# Patient Record
Sex: Female | Born: 1985
Health system: Southern US, Community
[De-identification: ages and names within clinical notes are randomized; demographics above are authoritative.]

## PROBLEM LIST (undated history)

## (undated) DIAGNOSIS — M722 Plantar fascial fibromatosis: Secondary | ICD-10-CM

## (undated) DIAGNOSIS — E559 Vitamin D deficiency, unspecified: Secondary | ICD-10-CM

## (undated) DIAGNOSIS — R87619 Unspecified abnormal cytological findings in specimens from cervix uteri: Secondary | ICD-10-CM

## (undated) DIAGNOSIS — B977 Papillomavirus as the cause of diseases classified elsewhere: Secondary | ICD-10-CM

## (undated) DIAGNOSIS — R87629 Unspecified abnormal cytological findings in specimens from vagina: Secondary | ICD-10-CM

## (undated) HISTORY — PX: COLPOSCOPY: SHX161

## (undated) HISTORY — DX: Papillomavirus as the cause of diseases classified elsewhere: B97.7

## (undated) HISTORY — DX: Unspecified abnormal cytological findings in specimens from cervix uteri: R87.619

## (undated) HISTORY — DX: Unspecified abnormal cytological findings in specimens from vagina: R87.629

## (undated) HISTORY — DX: Vitamin D deficiency, unspecified: E55.9

---

## 2016-12-15 DIAGNOSIS — R87613 High grade squamous intraepithelial lesion on cytologic smear of cervix (HGSIL): Secondary | ICD-10-CM | POA: Insufficient documentation

## 2016-12-15 DIAGNOSIS — R87810 Cervical high risk human papillomavirus (HPV) DNA test positive: Secondary | ICD-10-CM | POA: Insufficient documentation

## 2016-12-15 HISTORY — DX: High grade squamous intraepithelial lesion on cytologic smear of cervix (HGSIL): R87.613

## 2020-02-13 ENCOUNTER — Telehealth: Payer: Self-pay | Admitting: *Deleted

## 2020-02-13 NOTE — Telephone Encounter (Signed)
Returned call from 1:32 PM. Left a message to call and schedule appointment.

## 2020-02-13 NOTE — Telephone Encounter (Signed)
Returned call from 02/12/2020 at 4:35 PM. Left message to call and schedule New OB appointment.

## 2020-02-26 ENCOUNTER — Encounter: Payer: Self-pay | Admitting: *Deleted

## 2020-02-28 ENCOUNTER — Other Ambulatory Visit: Payer: Self-pay

## 2020-02-28 ENCOUNTER — Encounter: Payer: Self-pay | Admitting: Obstetrics & Gynecology

## 2020-02-28 ENCOUNTER — Other Ambulatory Visit (HOSPITAL_COMMUNITY)
Admission: RE | Admit: 2020-02-28 | Discharge: 2020-02-28 | Disposition: A | Discharge status: Home / Self Care | Payer: 59 | Source: Ambulatory Visit | Attending: Obstetrics & Gynecology | Admitting: Obstetrics & Gynecology

## 2020-02-28 ENCOUNTER — Ambulatory Visit (INDEPENDENT_AMBULATORY_CARE_PROVIDER_SITE_OTHER): Payer: 59 | Admitting: Obstetrics & Gynecology

## 2020-02-28 VITALS — BP 121/66 | HR 82 | Ht 61.0 in | Wt 180.0 lb

## 2020-02-28 DIAGNOSIS — O26891 Other specified pregnancy related conditions, first trimester: Secondary | ICD-10-CM | POA: Insufficient documentation

## 2020-02-28 DIAGNOSIS — Z7689 Persons encountering health services in other specified circumstances: Secondary | ICD-10-CM | POA: Diagnosis not present

## 2020-02-28 DIAGNOSIS — Z3A10 10 weeks gestation of pregnancy: Secondary | ICD-10-CM | POA: Diagnosis not present

## 2020-02-28 DIAGNOSIS — O09511 Supervision of elderly primigravida, first trimester: Secondary | ICD-10-CM | POA: Diagnosis not present

## 2020-02-28 DIAGNOSIS — G43009 Migraine without aura, not intractable, without status migrainosus: Secondary | ICD-10-CM | POA: Diagnosis not present

## 2020-02-28 DIAGNOSIS — Z3490 Encounter for supervision of normal pregnancy, unspecified, unspecified trimester: Secondary | ICD-10-CM | POA: Diagnosis not present

## 2020-02-28 DIAGNOSIS — Z8742 Personal history of other diseases of the female genital tract: Secondary | ICD-10-CM | POA: Insufficient documentation

## 2020-02-28 DIAGNOSIS — R87613 High grade squamous intraepithelial lesion on cytologic smear of cervix (HGSIL): Secondary | ICD-10-CM | POA: Diagnosis not present

## 2020-02-28 DIAGNOSIS — Z3401 Encounter for supervision of normal first pregnancy, first trimester: Secondary | ICD-10-CM

## 2020-02-28 LAB — HEPATITIS C ANTIBODY: HCV Ab: NEGATIVE

## 2020-02-28 MED ORDER — PRENATAL VITAMIN 27-0.8 MG PO TABS: ORAL_TABLET | ORAL | 11 refills | Status: DC

## 2020-02-28 NOTE — Progress Notes (Signed)
Bedside U/S shows single IUP with FHT of 158 BPM and CRL measures 31.77mm  GA measures [redacted]w[redacted]d

## 2020-02-28 NOTE — Progress Notes (Signed)
History:   Elizabeth Perkins is a 35 y.o. G1P0 at [redacted]w[redacted]d by LMP being seen today for her first obstetrical visit.  Her obstetrical history is significant for advanced maternal age. Patient does intend to breast feed. Pregnancy history fully reviewed.  Patient reports some pelvic cramping that is mild.  Denies any vaginal bleeding.     HISTORY: OB History  Gravida Para Term Preterm AB Living  1 0 0 0 0 0  SAB IAB Ectopic Multiple Live Births  0 0 0 0 0    # Outcome Date GA Lbr Len/2nd Weight Sex Delivery Anes PTL Lv  1 Current             Last pap smear was done 37902 and was abnormal - HGSIL.  Pt never went for follow up.  h/o ASCUS pap the year prior with colposcopy and biopsy showing CIN 1.  Past Medical History:  Diagnosis Date  . Abnormal Pap smear of cervix    HGSIL 2019 Never followed up  . HPV in female   . Vaginal Pap smear, abnormal    ASCUS  . Vitamin D deficiency    Past Surgical History:  Procedure Laterality Date  . COLPOSCOPY     History reviewed. No pertinent family history. Social History   Tobacco Use  . Smoking status: Never Smoker  . Smokeless tobacco: Never Used  Vaping Use  . Vaping Use: Never used  Substance Use Topics  . Alcohol use: Not Currently  . Drug use: Never   No Known Allergies Current Outpatient Medications on File Prior to Visit  Medication Sig Dispense Refill  . SUMAtriptan (IMITREX) 25 MG tablet TAKE 1 TABLET BY MOUTH EVERY 2 HOURS AS NEEDED FOR MIGRAINE. NO MORE THAN 4 TABLETS IN 24 HOURS    . Multiple Vitamin (MULTIVITAMIN) tablet Take 1 tablet by mouth daily.     No current facility-administered medications on file prior to visit.    Review of Systems Pertinent items noted in HPI and remainder of comprehensive ROS otherwise negative.  Physical Exam:   Vitals:   02/28/20 1036  BP: 121/66  Pulse: 82  Weight: 180 lb (81.6 kg)  Height: 5\' 1"  (1.549 m)   Fetal Heart Rate (bpm): 158 Bedside Ultrasound for FHR check:  Viable intrauterine pregnancy with positive cardiac activity noted, fetal heart rate 158bpm Patient informed that the ultrasound is considered a limited obstetric ultrasound and is not intended to be a complete ultrasound exam.  Patient also informed that the ultrasound is not being completed with the intent of assessing for fetal or placental anomalies or any pelvic abnormalities.  Explained that the purpose of today's ultrasound is to assess for fetal heart rate.  Patient acknowledges the purpose of the exam and the limitations of the study. General: well-developed, well-nourished female in no acute distress  Breasts:  normal appearance, no masses or tenderness bilaterally  Skin: normal coloration and turgor, no rashes  Neurologic: oriented, normal, negative, normal mood  Extremities: normal strength, tone, and muscle mass, ROM of all joints is normal  HEENT PERRLA, extraocular movement intact and sclera clear, anicteric  Neck supple and no masses  Cardiovascular: regular rate and rhythm  Respiratory:  no respiratory distress, normal breath sounds  Abdomen: soft, non-tender; bowel sounds normal; no masses,  no organomegaly  Pelvic: normal external genitalia, no lesions, normal vaginal mucosa, normal vaginal discharge, normal cervix, pap smear done. Uterine size:  10-12 weeks    Assessment:  Pregnancy: G1P0 Patient Active Problem List   Diagnosis Date Noted  . Encounter for supervision of normal pregnancy 02/28/2020  . ASCUS with positive high risk HPV cervical 12/15/2016     Plan:    1. [redacted] weeks gestation of pregnancy - Obstetric panel - HIV antibody (with reflex) - Hepatitis C Antibody - Hemoglobinopathy Evaluation - Babyscripts Schedule Optimization - [place] bedside; Future - Culture, OB Urine - Cytology - PAP( [place])  2. Encounter for supervision of normal first pregnancy in first trimester - continue PNV  3. History of abnormal cervical Pap smear - pap smear with HR  HPV obtained today  4. primigravid of advanced maternal age in first trimester - will start baby ASA after 13 weeks.  5. Migraine without aura and without status migrainosus, not intractable - pt aware to stop all migraine medications   Initial labs drawn. Continue prenatal vitamins. Problem list reviewed and updated. Genetic Screening discussed, First trimester screen and Quad screen: Declined for now.Marland Kitchen Ultrasound discussed; fetal anatomic survey: requested. Anticipatory guidance about prenatal visits given including labs, ultrasounds, and testing. Discussed usage of Babyscripts and virtual visits as additional source of managing and completing prenatal visits in midst of coronavirus and pandemic.   Encouraged to complete MyChart Registration for her ability to review results, send requests, and have questions addressed.  The nature of [place] [facility] with multiple MDs and Advanced Practice Providers was explained to patient; also emphasized that residents, students are part of our team. Routine obstetric precautions reviewed. Encouraged to seek out care at office or emergency room [facility] preferred) for urgent and/or emergent concerns. Return in about 4 weeks (around 03/27/2020).     Lum Keas, MD, FACOG Obstetrician & Gynecologist, [facility] for [facility], [facility]

## 2020-03-01 LAB — CYTOLOGY - PAP
Chlamydia: NEGATIVE
Comment: NEGATIVE
Comment: NEGATIVE
Comment: NORMAL
Diagnosis: HIGH — AB
High risk HPV: POSITIVE — AB
Neisseria Gonorrhea: NEGATIVE

## 2020-03-01 LAB — URINE CULTURE, OB REFLEX

## 2020-03-01 LAB — CULTURE, OB URINE

## 2020-03-04 LAB — OBSTETRIC PANEL
Absolute Monocytes: 946 cells/uL (ref 200–950)
Antibody Screen: NOT DETECTED
Basophils Absolute: 44 cells/uL (ref 0–200)
Basophils Relative: 0.4 %
Eosinophils Absolute: 66 cells/uL (ref 15–500)
Eosinophils Relative: 0.6 %
HCT: 37.7 % (ref 35.0–45.0)
Hemoglobin: 12.9 g/dL (ref 11.7–15.5)
Hepatitis B Surface Ag: NONREACTIVE
Lymphs Abs: 1683 cells/uL (ref 850–3900)
MCH: 27.1 pg (ref 27.0–33.0)
MCHC: 34.2 g/dL (ref 32.0–36.0)
MCV: 79.2 fL — ABNORMAL LOW (ref 80.0–100.0)
MPV: 10.8 fL (ref 7.5–12.5)
Monocytes Relative: 8.6 %
Neutro Abs: 8261 cells/uL — ABNORMAL HIGH (ref 1500–7800)
Neutrophils Relative %: 75.1 %
Platelets: 321 10*3/uL (ref 140–400)
RBC: 4.76 10*6/uL (ref 3.80–5.10)
RDW: 14.1 % (ref 11.0–15.0)
RPR Ser Ql: NONREACTIVE
Rubella: 3.03 Index
Total Lymphocyte: 15.3 %
WBC: 11 10*3/uL — ABNORMAL HIGH (ref 3.8–10.8)

## 2020-03-04 LAB — HEMOGLOBINOPATHY EVALUATION
Fetal Hemoglobin Testing: <1 % (ref 0.0–1.9)
HCT: 39.4 % (ref 35.0–45.0)
Hemoglobin A2 - HGBRFX: 2.3 % (ref 2.2–3.2)
Hemoglobin: 13 g/dL (ref 11.7–15.5)
Hgb A: 97.7 % (ref >96.0)
MCH: 26.3 pg — ABNORMAL LOW (ref 27.0–33.0)
MCV: 79.6 fL — ABNORMAL LOW (ref 80.0–100.0)
RBC: 4.95 10*6/uL (ref 3.80–5.10)
RDW: 14.4 % (ref 11.0–15.0)

## 2020-03-04 LAB — HIV ANTIBODY (ROUTINE TESTING W REFLEX): HIV 1&2 Ab, 4th Generation: NONREACTIVE

## 2020-03-04 LAB — HEPATITIS C ANTIBODY
Hepatitis C Ab: NONREACTIVE
SIGNAL TO CUT-OFF: 0 (ref <1.00)

## 2020-03-24 ENCOUNTER — Telehealth: Payer: Self-pay | Admitting: *Deleted

## 2020-03-24 MED ORDER — DOXYLAMINE-PYRIDOXINE 10-10 MG PO TBEC: 1.0000 | DELAYED_RELEASE_TABLET | Freq: Two times a day (BID) | ORAL | 3 refills | Status: DC

## 2020-03-24 NOTE — Telephone Encounter (Signed)
Pt request anti nausea me be sent in to her pharmacy.  Per protocol [facility] to [facility].

## 2020-03-27 ENCOUNTER — Encounter: Payer: Self-pay | Admitting: Obstetrics and Gynecology

## 2020-03-27 ENCOUNTER — Ambulatory Visit (INDEPENDENT_AMBULATORY_CARE_PROVIDER_SITE_OTHER): Payer: 59 | Admitting: Obstetrics and Gynecology

## 2020-03-27 ENCOUNTER — Other Ambulatory Visit: Payer: Self-pay

## 2020-03-27 VITALS — BP 112/65 | HR 85 | Wt 181.0 lb

## 2020-03-27 DIAGNOSIS — Z31430 Encounter of female for testing for genetic disease carrier status for procreative management: Secondary | ICD-10-CM | POA: Diagnosis not present

## 2020-03-27 DIAGNOSIS — R87613 High grade squamous intraepithelial lesion on cytologic smear of cervix (HGSIL): Secondary | ICD-10-CM | POA: Diagnosis not present

## 2020-03-27 DIAGNOSIS — Z3402 Encounter for supervision of normal first pregnancy, second trimester: Secondary | ICD-10-CM

## 2020-03-27 NOTE — Progress Notes (Signed)
   PRENATAL VISIT NOTE  Subjective:  Elizabeth Perkins is a 35 y.o. G1P0 at [redacted]w[redacted]d being seen today for ongoing prenatal care.  She is currently monitored for the following issues for this high-risk pregnancy and has HGSIL (high grade squamous intraepithelial lesion) on Pap smear of cervix and Encounter for supervision of normal pregnancy on their problem list.  Patient reports no complaints.  Contractions: Not present. Vag. Bleeding: None.  Movement: Absent. Denies leaking of fluid.   The following portions of the patient's history were reviewed and updated as appropriate: allergies, current medications, past family history, past medical history, past social history, past surgical history and problem list.   Objective:   Vitals:   03/27/20 1454  BP: 112/65  Pulse: 85  Weight: 181 lb (82.1 kg)    Fetal Status: Fetal Heart Rate (bpm): 161   Movement: Absent     General:  Alert, oriented and cooperative. Patient is in no acute distress.  Skin: Skin is warm and dry. No rash noted.   Cardiovascular: Normal heart rate noted  Respiratory: Normal respiratory effort, no problems with respiration noted  Abdomen: Soft, gravid, appropriate for gestational age.  Pain/Pressure: Absent     Pelvic: Cervical exam deferred        Extremities: Normal range of motion.  Edema: None  Mental Status: Normal mood and affect. Normal behavior. Normal judgment and thought content.   Assessment and Plan:  Pregnancy: G1P0 at [redacted]w[redacted]d 1. Encounter for supervision of normal first pregnancy in second trimester Patient is doing well without complaints Continue ASA Anatomy ultrasound scheduled in March Panorama today  2. HGSIL (high grade squamous intraepithelial lesion) on Pap smear of cervix Colpo done today. See separate note  Preterm labor symptoms and general obstetric precautions including but not limited to vaginal bleeding, contractions, leaking of fluid and fetal movement were reviewed in detail with the  patient. Please refer to After Visit Summary for other counseling recommendations.   No follow-ups on file.  Future Appointments  Date Time Provider Department Center  04/30/2020 10:30 AM [facility] US3 [facility]    Catalina Antigua, MD

## 2020-03-27 NOTE — Progress Notes (Signed)
Patient with HGSIL on 02/2020 pap smear here for colposcopy  Patient given informed consent, signed copy in the chart, time out was performed.  Placed in lithotomy position. Cervix viewed with speculum and colposcope after application of acetic acid.   Colposcopy adequate?  yes Acetowhite lesions? no Punctation? no Mosaicism?  no Abnormal vasculature?  no Biopsies? no ECC? no  COMMENTS:  Patient was given post procedure instructions.  Plan for repeat colposcopy 6 weeks pp  Catalina Antigua, MD

## 2020-03-28 DIAGNOSIS — Z3482 Encounter for supervision of other normal pregnancy, second trimester: Secondary | ICD-10-CM | POA: Diagnosis not present

## 2020-04-07 DIAGNOSIS — Z3402 Encounter for supervision of normal first pregnancy, second trimester: Secondary | ICD-10-CM

## 2020-04-14 ENCOUNTER — Other Ambulatory Visit: Payer: Self-pay | Admitting: *Deleted

## 2020-04-14 DIAGNOSIS — Z141 Cystic fibrosis carrier: Secondary | ICD-10-CM | POA: Insufficient documentation

## 2020-04-14 HISTORY — DX: Cystic fibrosis carrier: Z14.1

## 2020-04-14 NOTE — Progress Notes (Signed)
Message sent to patient via mychart informing her that she is a carrier of CF gene.  Ambulatory referral sent for MFM genetics for counseling and partner to be tested.  Encouraged pt to call office if she has any questions.

## 2020-04-23 ENCOUNTER — Ambulatory Visit: Payer: Self-pay

## 2020-04-23 ENCOUNTER — Other Ambulatory Visit: Payer: Self-pay

## 2020-04-23 ENCOUNTER — Ambulatory Visit: Payer: 59 | Attending: Obstetrics and Gynecology | Admitting: Genetic Counselor

## 2020-04-23 DIAGNOSIS — Z315 Encounter for genetic counseling: Secondary | ICD-10-CM | POA: Diagnosis not present

## 2020-04-23 DIAGNOSIS — O09892 Supervision of other high risk pregnancies, second trimester: Secondary | ICD-10-CM

## 2020-04-23 DIAGNOSIS — Z141 Cystic fibrosis carrier: Secondary | ICD-10-CM

## 2020-04-23 NOTE — Progress Notes (Signed)
04/23/2020  Elizabeth Perkins 11-10-1985 MRN: [id number] DOV: 04/23/2020  Elizabeth Perkins presented to the [facility] for a genetics consultation regarding her carrier status for cystic fibrosis. Elizabeth Perkins was accompanied to her appointment by her partner, Elizabeth Perkins.   Indication for genetic counseling - Carrier for cystic fibrosis  Prenatal history  Elizabeth Perkins is a G55P0, 35 y.o. female. Her current pregnancy has completed [redacted]w[redacted]d(Estimated Date of Delivery: 09/24/20).  Elizabeth Perkins exposure to environmental toxins or chemical agents. She denied the use of alcohol, tobacco or street drugs. She reported taking prenatal vitamins and doxylamine-pyridoxine. She denied significant viral illnesses, fevers, and bleeding during the course of her pregnancy. Her medical and surgical histories were noncontributory.  Family History  A three generation pedigree was drafted and reviewed. The family history is remarkable for the following:  - Elizabeth Perkins's maternal grandmother reportedly had four miscarriages. However, she also had seven healthy children. The cause of her miscarriages is unknown.  - Elizabeth Perkins had a history of pancreatitis. It has been suggested that individuals who are carriers for cystic fibrosis are at increased risk for pancreatitis. See Discussion section for more details.  The remaining family histories were reviewed and found to be noncontributory for birth defects, intellectual disability, recurrent pregnancy loss, and known genetic conditions. Elizabeth Perkins limited information about her paternal family history; thus, risk assessment was limited.   The patient's ancestry is [place] The father of the pregnancy's ancestry is [place] Ashkenazi Jewish ancestry and consanguinity were denied. Pedigree will be scanned under Media.  Discussion  Cystic fibrosis:  Elizabeth Perkins referred for  genetic counseling as she had Horizon-14 carrier screening performed that identified her as a carrier for cystic fibrosis (CF). We discussed that CF is a condition characterized by the buildup of thick, sticky mucus that can damage the body's organs. Mucus is a slippery substance that lubricates and protects the linings of the airways, digestive system, reproductive system, and other organs and tissues. Individuals with CF have abnormally sticky mucus that can clog the airways and digestive system, leading to progressive damage to the respiratory system and chronic digestive system problems. The most common features of CF include respiratory difficulties, bacterial infections in the lungs, the formation of scar tissue (fibrosis) and cysts in the lungs, pancreatic insufficiency, CF-related diabetes mellitus, diarrhea, malnutrition, poor growth, and weight loss. Most men with CF have congenital bilateral absence of the vas deferens (CBAVD) which causes female infertility. With therapies, such as daily respiratory therapies and medications to aid digestion, the median lifespan for people with CF is now in the 40's. Treatment may involve lung transplantation and CFTR protein modulators in some cases. CF is variably expressed, meaning features of the condition and their severity vary among affected individuals. Clinical manifestations of CF present in the first year of life for 66% of affected individuals but are lifelong. Early diagnosis of CF is facilitated by newborn screening before the onset of symptoms.   We reviewed that CF is caused by mutations in the CFTR gene. This gene provides instructions for a channel that transports chloride ions into and out of cells. The flow of chloride ions helps control the movement of water in the body's tissues, which is necessary for the production of thin, freely flowing mucus. The CFTR protein also regulates the function of other channels, including those that transport sodium  irons across cell membranes. This process  is necessary for normal function of the lungs and pancreas. Pathogenic variants in the CFTR gene alter the production, structure, or stability of the CFTR protein. All of these changes prevent the associated channels from functioning properly, which impairs the transport of chloride ions and the movement of water into and out of cells. As a result, cells that line the passageway of the lungs, pancreas, and other organs produce mucus that obstructs airways and glands, leading to the characteristic signs and symptoms of CF.   We discussed that expression and severity of CF depends upon the specific pathogenic variants in the CFTR gene in an affected individual. More than 1000 pathogenic variants in the CFTR gene have been described. Most of these variants change single amino acids in the CFTR protein or delete a small amount of DNA from the CFTR gene. Elizabeth Perkins carrier screen was positive for the c.695T>A (p.V232D) variant in the CFTR gene. This variant is associated with pancreatic-sufficient CF when inherited with another severe CF-causing variant. This variant has also been identified in individuals with pulmonary involvement in adulthood and in males with CBAVD. We reviewed that knowing the specific variants the fetus can inherit would be useful in determining possible prognosis and treatment options if the fetus were to be affected by CF.  Elizabeth Perkins was counseled that CF is inherited in an autosomal recessive fashion. This means that the current fetus is only at risk for CF if Elizabeth Perkins's partner is also a carrier for the condition. Based on the carrier frequency for CF in the Caucasian population, Elizabeth Perkins partner has a 1 in 28 chance of being a carrier for CF. If Elizabeth Perkins paternal Perkins had a history of pancreatitis due to him being a CF carrier, Elizabeth Perkins chance of being a CF carrier would be increased to 1 in 4 (25%). If Elizabeth Perkins is also  identified to be a carrier, the risk for CF in the current fetus and each of the couple's future pregnancies would be 1 in 4 (25%).   Ms. Bautch carrier screening was negative for the other 3 conditions screened. Thus, her risk to be a carrier for these additional conditions (listed separately in the laboratory report) has been reduced but not eliminated. This also significantly reduces her risk of having a child affected by one of these conditions. We discussed that carrier testing for CF is recommended for Ms. Bresnan's partner. Ms. Pickelsimer and her partner indicated that they are interested in pursuing partner carrier screening. They also understand that newborn screening performed for all infants in [place] assesses for CF after birth.  We discussed the recommendation that Ms. Ohlendorf inform her siblings about her CF carrier status. If Ms. Carrell inherited her gene change from her mother, her half sister and brother would have a 50% chance of being a carrier for CF themselves. Ms. Millican siblings and their reproductive partners may consider carrier screening for CF either during the preconception period or during pregnancies in the future to refine their chance of having a child affected by CF.  Aneuploidy screening results:  We also reviewed that Ms. Rothbauer had Panorama NIPS through the laboratory [facility] that was low-risk for fetal aneuploidies. We reviewed that these results showed a less than 1 in 10,000 risk for trisomies 21, 18 and 13, and monosomy X (Turner syndrome).  In addition, the risk for triploidy and sex chromosome trisomies (47,XXX and 47,XXY) was also low. Ms. Benner elected to have cfDNA analysis for 22q11.2  deletion syndrome, which was also low risk (1 in 12000). We reviewed that while this testing identifies 94-99% of pregnancies with trisomy 78, trisomy 82, trisomy 23, sex chromosome aneuploidies, and triploidy, it is NOT diagnostic. A positive test result requires  confirmation by CVS or amniocentesis, and a negative test result does not rule out a fetal chromosome abnormality. She also understands that this testing does not identify all genetic conditions.  Diagnostic testing:  Ms. Korte was also counseled regarding diagnostic testing via amniocentesis. We discussed the technical aspects of the procedure and quoted up to a 1 in 500 (0.2%) risk for spontaneous pregnancy loss or other adverse pregnancy outcomes as a result of amniocentesis. Cultured cells from an amniocentesis sample allow for the visualization of a fetal karyotype, which can detect >99% of large chromosomal aberrations. Chromosomal microarray can also be performed to identify smaller deletions or duplications of fetal chromosomal material. Amniocentesis could also be performed to assess whether the baby is affected by CF. After careful consideration, Ms. Yager declined amniocentesis at this time. She understands that amniocentesis is available at any point after 16 weeks of pregnancy and that she may opt to undergo the procedure at a later date should she change her mind. She informed me that if her partner is found to be a carrier for CF, she would prefer to wait for newborn screening to determine if the baby has CF.  Plan:  Ms. Sorenson and her partner Elizabeth Perkins were interested in pursuing partner carrier screening for CF; however, Elizabeth Perkins does not have health insurance. Elizabeth Perkins qualifies for free testing for him through the laboratory [facility]'s Patient Assistance Program. He signed the application form during today's appointment and Ms. Postel agreed to send me a photo of their tax form from last year. I will send this to the lab once I receive it for income verification purposes.   I provided Elizabeth Perkins with a saliva kit and reviewed instructions for sample collection. Results will take 2-3 weeks to be returned from the time the laboratory receives his sample. I will call the couple once  his results become available.  I counseled Ms. Esther regarding the above risks and available options. The approximate face-to-face time with the genetic counselor was 50 minutes.  In summary:  Discussed carrier screening results and options for follow-up testing  Carrier for cystic fibrosis  Opted to pursue partner carrier screening. Partner qualifies for free testing. Patient will send me tax forms for income verification and partner will collect sample via saliva kit. We will follow results  Reviewed low-risk NIPS result  Reduction in risk for Down syndrome, trisomy 47, trisomy 36, triploidy, sex chromosome aneuploidies, and 22q11.2 deletion syndrome  Offered additional testing and screening  Declined amniocentesis  Reviewed family history concerns   Buelah Manis, MS, Counselling psychologist

## 2020-04-24 ENCOUNTER — Ambulatory Visit (INDEPENDENT_AMBULATORY_CARE_PROVIDER_SITE_OTHER): Payer: 59 | Admitting: Obstetrics and Gynecology

## 2020-04-24 ENCOUNTER — Encounter: Payer: Self-pay | Admitting: Obstetrics and Gynecology

## 2020-04-24 ENCOUNTER — Encounter: Payer: Self-pay | Admitting: Genetic Counselor

## 2020-04-24 VITALS — BP 114/61 | HR 77 | Wt 177.0 lb

## 2020-04-24 DIAGNOSIS — Z3402 Encounter for supervision of normal first pregnancy, second trimester: Secondary | ICD-10-CM

## 2020-04-24 DIAGNOSIS — R87613 High grade squamous intraepithelial lesion on cytologic smear of cervix (HGSIL): Secondary | ICD-10-CM

## 2020-04-24 DIAGNOSIS — Z141 Cystic fibrosis carrier: Secondary | ICD-10-CM

## 2020-04-24 DIAGNOSIS — O09519 Supervision of elderly primigravida, unspecified trimester: Secondary | ICD-10-CM | POA: Insufficient documentation

## 2020-04-24 NOTE — Progress Notes (Signed)
   PRENATAL VISIT NOTE  Subjective:  Elizabeth Perkins is a 35 y.o. G1P0 at [redacted]w[redacted]d being seen today for ongoing prenatal care.  She is currently monitored for the following issues for this high-risk pregnancy and has HGSIL (high grade squamous intraepithelial lesion) on Pap smear of cervix; Encounter for supervision of normal pregnancy; Cystic fibrosis gene carrier; and AMA (advanced maternal age) primigravida 35+, unspecified trimester on their problem list.  Patient reports no complaints.  Contractions: Not present. Vag. Bleeding: None.  Movement: Absent. Denies leaking of fluid.   The following portions of the patient's history were reviewed and updated as appropriate: allergies, current medications, past family history, past medical history, past social history, past surgical history and problem list.   Objective:   Vitals:   04/24/20 1308  BP: 114/61  Pulse: 77  Weight: 177 lb (80.3 kg)    Fetal Status: Fetal Heart Rate (bpm): 139   Movement: Absent     General:  Alert, oriented and cooperative. Patient is in no acute distress.  Skin: Skin is warm and dry. No rash noted.   Cardiovascular: Normal heart rate noted  Respiratory: Normal respiratory effort, no problems with respiration noted  Abdomen: Soft, gravid, appropriate for gestational age.  Pain/Pressure: Absent     Pelvic: Cervical exam deferred        Extremities: Normal range of motion.  Edema: None  Mental Status: Normal mood and affect. Normal behavior. Normal judgment and thought content.   Assessment and Plan:  Pregnancy: G1P0 at [redacted]w[redacted]d 1. Encounter for supervision of normal first pregnancy in second trimester Patient is doing well without complaints Anatomy ultrasound next week - Alpha fetoprotein, maternal  2. HGSIL (high grade squamous intraepithelial lesion) on Pap smear of cervix S/p colpo  3. Cystic fibrosis gene carrier Seen by genetic counselor FOB testing in progress  4. AMA (advanced maternal age)  primigravida 35+, unspecified trimester   Preterm labor symptoms and general obstetric precautions including but not limited to vaginal bleeding, contractions, leaking of fluid and fetal movement were reviewed in detail with the patient. Please refer to After Visit Summary for other counseling recommendations.   Return in about 4 weeks (around 05/22/2020) for in person, ROB, Low risk.  Future Appointments  Date Time Provider Department Center  04/30/2020 10:45 AM [facility] US4 [facility]    Catalina Antigua, MD

## 2020-04-30 ENCOUNTER — Other Ambulatory Visit: Payer: Self-pay

## 2020-04-30 ENCOUNTER — Other Ambulatory Visit: Payer: Self-pay | Admitting: *Deleted

## 2020-04-30 ENCOUNTER — Ambulatory Visit: Payer: 59 | Attending: Obstetrics & Gynecology

## 2020-04-30 ENCOUNTER — Other Ambulatory Visit: Payer: Self-pay | Admitting: Obstetrics & Gynecology

## 2020-04-30 DIAGNOSIS — Z363 Encounter for antenatal screening for malformations: Secondary | ICD-10-CM | POA: Diagnosis not present

## 2020-04-30 DIAGNOSIS — Z3A10 10 weeks gestation of pregnancy: Secondary | ICD-10-CM | POA: Diagnosis not present

## 2020-04-30 DIAGNOSIS — Z362 Encounter for other antenatal screening follow-up: Secondary | ICD-10-CM

## 2020-04-30 DIAGNOSIS — Z148 Genetic carrier of other disease: Secondary | ICD-10-CM | POA: Insufficient documentation

## 2020-04-30 DIAGNOSIS — Z3A19 19 weeks gestation of pregnancy: Secondary | ICD-10-CM

## 2020-04-30 DIAGNOSIS — O09512 Supervision of elderly primigravida, second trimester: Secondary | ICD-10-CM | POA: Insufficient documentation

## 2020-04-30 DIAGNOSIS — O358XX0 Maternal care for other (suspected) fetal abnormality and damage, not applicable or unspecified: Secondary | ICD-10-CM | POA: Insufficient documentation

## 2020-05-02 LAB — ALPHA FETOPROTEIN, MATERNAL
AFP MoM: 1.48
AFP, Serum: 57.2 ng/mL
Calc'd Gestational Age: 18.1 weeks
Maternal Wt: 177 [lb_av]
Risk for ONTD: 1
Twins-AFP: 1

## 2020-05-02 LAB — TIQ-MISC

## 2020-05-14 ENCOUNTER — Telehealth: Payer: Self-pay | Admitting: Genetic Counselor

## 2020-05-14 NOTE — Telephone Encounter (Signed)
Second year [employer] genetic counseling student Rolly Pancake Ketchmore called Ms. Elizabeth Perkins and Mr. Elizabeth Perkins to discuss Mr. Elizabeth Perkins negative carrier screening results for cystic fibrosis (CF). Mr. Elizabeth Perkins was negative for pathogenic variants in the CFTR gene associated with CF. Based on this negative result and his ethnic background, he has a 1 in 2700 residual risk of being a carrier for CF. This reduces the chance of the couple having a child with CF to 1 in 10,800 (0.009%). The couple was relieved to hear of these results and confirmed that they had no further questions at this time.   Gershon Crane, MS, [facility] Genetic Counselor

## 2020-05-22 ENCOUNTER — Ambulatory Visit (INDEPENDENT_AMBULATORY_CARE_PROVIDER_SITE_OTHER): Payer: 59 | Admitting: Obstetrics & Gynecology

## 2020-05-22 ENCOUNTER — Other Ambulatory Visit: Payer: Self-pay

## 2020-05-22 VITALS — BP 103/60 | HR 78 | Wt 180.0 lb

## 2020-05-22 DIAGNOSIS — O09519 Supervision of elderly primigravida, unspecified trimester: Secondary | ICD-10-CM

## 2020-05-22 DIAGNOSIS — Z3402 Encounter for supervision of normal first pregnancy, second trimester: Secondary | ICD-10-CM

## 2020-05-22 DIAGNOSIS — Z3A22 22 weeks gestation of pregnancy: Secondary | ICD-10-CM

## 2020-05-22 NOTE — Patient Instructions (Signed)
TDaP Vaccine Pregnancy Get the Whooping Cough Vaccine While You Are Pregnant (CDC)  It is important for women to get the whooping cough vaccine in the third trimester of each pregnancy. Vaccines are the best way to prevent this disease. There are 2 different whooping cough vaccines. Both vaccines combine protection against whooping cough, tetanus and diphtheria, but they are for different age groups: Tdap: for everyone 11 years or older, including pregnant women  DTaP: for children 2 months through 6 years of age  You need the whooping cough vaccine during each of your pregnancies The recommended time to get the shot is during your 27th through 36th week of pregnancy, preferably during the earlier part of this time period. The Centers for Disease Control and Prevention (CDC) recommends that pregnant women receive the whooping cough vaccine for adolescents and adults (called Tdap vaccine) during the third trimester of each pregnancy. The recommended time to get the shot is during your 27th through 36th week of pregnancy, preferably during the earlier part of this time period. This replaces the original recommendation that pregnant women get the vaccine only if they had not previously received it. The American College of Obstetricians and Gynecologists and the American College of Nurse-Midwives support this recommendation.  You should get the whooping cough vaccine while pregnant to pass protection to your baby frame support disabled and/or not supported in this browser  Learn why Elizabeth Perkins decided to get the whooping cough vaccine in her 3rd trimester of pregnancy and how her baby girl was born with some protection against the disease. Also available on YouTube. After receiving the whooping cough vaccine, your body will create protective antibodies (proteins produced by the body to fight off diseases) and pass some of them to your baby before birth. These antibodies provide your baby some short-term  protection against whooping cough in early life. These antibodies can also protect your baby from some of the more serious complications that come along with whooping cough. Your protective antibodies are at their highest about 2 weeks after getting the vaccine, but it takes time to pass them to your baby. So the preferred time to get the whooping cough vaccine is early in your third trimester. The amount of whooping cough antibodies in your body decreases over time. That is why CDC recommends you get a whooping cough vaccine during each pregnancy. Doing so allows each of your babies to get the greatest number of protective antibodies from you. This means each of your babies will get the best protection possible against this disease.  Getting the whooping cough vaccine while pregnant is better than getting the vaccine after you give birth Whooping cough vaccination during pregnancy is ideal so your baby will have short-term protection as soon as he is born. This early protection is important because your baby will not start getting his whooping cough vaccines until he is 2 months old. These first few months of life are when your baby is at greatest risk for catching whooping cough. This is also when he's at greatest risk for having severe, potentially life-threating complications from the infection. To avoid that gap in protection, it is best to get a whooping cough vaccine during pregnancy. You will then pass protection to your baby before he is born. To continue protecting your baby, he should get whooping cough vaccines starting at 2 months old. You may never have gotten the Tdap vaccine before and did not get it during this pregnancy. If so, you should make sure   to get the vaccine immediately after you give birth, before leaving the hospital or birthing center. It will take about 2 weeks before your body develops protection (antibodies) in response to the vaccine. Once you have protection from the vaccine,  you are less likely to give whooping cough to your newborn while caring for him. But remember, your baby will still be at risk for catching whooping cough from others. A recent study looked to see how effective Tdap was at preventing whooping cough in babies whose mothers got the vaccine while pregnant or in the hospital after giving birth. The study found that getting Tdap between 27 through 36 weeks of pregnancy is 85% more effective at preventing whooping cough in babies younger than 2 months old. Blood tests cannot tell if you need a whooping cough vaccine There are no blood tests that can tell you if you have enough antibodies in your body to protect yourself or your baby against whooping cough. Even if you have been sick with whooping cough in the past or previously received the vaccine, you still should get the vaccine during each pregnancy. Breastfeeding may pass some protective antibodies onto your baby By breastfeeding, you may pass some antibodies you have made in response to the vaccine to your baby. When you get a whooping cough vaccine during your pregnancy, you will have antibodies in your breast milk that you can share with your baby as soon as your milk comes in. However, your baby will not get protective antibodies immediately if you wait to get the whooping cough vaccine until after delivering your baby. This is because it takes about 2 weeks for your body to create antibodies. Learn more about the health benefits of breastfeeding.  

## 2020-05-22 NOTE — Progress Notes (Signed)
PRENATAL VISIT NOTE  Subjective:  Elizabeth Perkins is a 35 y.o. G1P0 at [redacted]w[redacted]d being seen today for ongoing prenatal care.  She is currently monitored for the following issues for this low-risk pregnancy and has HGSIL (high grade squamous intraepithelial lesion) on Pap smear of cervix; Encounter for supervision of normal pregnancy; Cystic fibrosis gene carrier; and AMA (advanced maternal age) primigravida 35+, unspecified trimester on their problem list.  Patient reports no complaints.  Contractions: Not present. Vag. Bleeding: None.  Movement: Present. Denies leaking of fluid.   The following portions of the patient's history were reviewed and updated as appropriate: allergies, current medications, past family history, past medical history, past social history, past surgical history and problem list.   Objective:   Vitals:   05/22/20 1351  BP: 103/60  Pulse: 78  Weight: 180 lb (81.6 kg)    Fetal Status: Fetal Heart Rate (bpm): 143   Movement: Present     General:  Alert, oriented and cooperative. Patient is in no acute distress.  Skin: Skin is warm and dry. No rash noted.   Cardiovascular: Normal heart rate noted  Respiratory: Normal respiratory effort, no problems with respiration noted  Abdomen: Soft, gravid, appropriate for gestational age.  Pain/Pressure: Absent     Pelvic: Cervical exam deferred        Extremities: Normal range of motion.  Edema: None  Mental Status: Normal mood and affect. Normal behavior. Normal judgment and thought content.   Imaging: [place] MFM OB DETAIL +14 WK  Result Date: 04/30/2020 ----------------------------------------------------------------------  OBSTETRICS REPORT                       (Signed Final 04/30/2020 04:47 pm) ---------------------------------------------------------------------- Patient Info  ID #:       [id number]                          D.O.B.:  Jan 19, 1986 (34 yrs)  Name:       Elizabeth Perkins                 Visit Date: 04/30/2020 11:36  am ---------------------------------------------------------------------- Performed By  Attending:        Noralee Space MD        Ref. Address:     [address], [place]  Performed By:     Earley Brooke     [facility], [facility]  Referred By:      Everardo [facility] [place] ---------------------------------------------------------------------- Orders  #  Description                           Code        Ordered By  1  US MFM OB DETAIL +14 WK               [id number]    Valentina ShaggyMARY MILLER ----------------------------------------------------------------------  #  Order #                     Accession #                Episode #  1  [id number]                   [id number]                 [id number] ---------------------------------------------------------------------- Indications  Advanced maternal age primigravida 4535+,        37O09.512  second trimester  6019 weeks gestation of pregnancy                Z3A.19  Encounter for antenatal screening for          Z36.3  malformations  Echogenic intracardiac focus of the heart      O35.8XX0  (EIF Left ventricle)  LOW risk NIPS  Genetic carrier (CF carrier)                   Z14.8 ---------------------------------------------------------------------- Fetal Evaluation  Num Of Fetuses:         1  Fetal Heart Rate(bpm):  147  Cardiac Activity:       Observed  Presentation:           Transverse, head to maternal left  Placenta:               Anterior  P. Cord Insertion:      Not well visualized  Amniotic Fluid  AFI FV:      Within normal limits                              Largest Pocket(cm)                              4.8 ---------------------------------------------------------------------- Biometry   BPD:      41.9  mm     G. Age:  18w 5d         37  %    CI:        71.18   %    70 - 86                                                          FL/HC:  17.6   %    16.1 - 18.3  HC:      158.2  mm     G. Age:  18w 5d         28  %    HC/AC:      1.16        1.09 - 1.39  AC:      136.9  mm     G. Age:  19w 1d         50  %    FL/BPD:     66.6   %  FL:       27.9  mm     G. Age:  18w 4d         27  %    FL/AC:      20.4   %    20 - 24  HUM:      27.5  mm     G. Age:  18w 6d         44  %  CER:      18.6  mm     G. Age:  18w 2d         10  %  NFT:       3.6  mm  LV:        5.7  mm  CM:        5.5  mm  Est. FW:     260  gm      0 lb 9 oz     36  % ---------------------------------------------------------------------- OB History  Gravidity:    1         Term:   0        Prem:   0        SAB:   0  TOP:          0       Ectopic:  0        Living: 0 ---------------------------------------------------------------------- Gestational Age  LMP:           19w 0d        Date:  12/19/19                 EDD:   09/24/20  U/S Today:     18w 6d                                        EDD:   09/25/20  Best:          19w 0d     Det. By:  LMP  (12/19/19)          EDD:   09/24/20 ---------------------------------------------------------------------- Anatomy  Cranium:               Appears normal         LVOT:                   Appears normal  Cavum:                 Appears normal         Aortic Arch:            Appears normal  Ventricles:            Appears normal         Ductal Arch:  Appears normal  Choroid Plexus:        Appears normal         Diaphragm:              Appears normal  Cerebellum:            Appears normal         Stomach:                Appears normal, left                                                                        sided  Posterior Fossa:       Appears normal         Abdomen:                Appears normal  Nuchal Fold:           Appears normal         Abdominal Wall:         Appears nml (cord                                                                         insert, abd wall)  Face:                  Appears normal         Cord Vessels:           Appears normal (3                         (orbits and profile)                           vessel cord)  Lips:                  Appears normal         Kidneys:                Appear normal  Palate:                Not well visualized    Bladder:                Appears normal  Thoracic:              Appears normal         Spine:                  Not well visualized  Heart:                 Appears normal; EIF    Upper Extremities:      Visualized  RVOT:                  Appears normal         Lower Extremities:  Visualized  Other:  Fetus appears to be female. Heels visualized. Technically difficult due          to fetal position. ---------------------------------------------------------------------- Cervix Uterus Adnexa  Cervix  Length:           3.17  cm.  Normal appearance by transabdominal scan.  Right Ovary  Within normal limits.  Left Ovary  Within normal limits.  Adnexa  No abnormality visualized. ---------------------------------------------------------------------- Myomas  Site                     L(cm)      W(cm)      D(cm)       Location  Anterior                 2.6        2.9        2.5 ----------------------------------------------------------------------  Blood Flow                  RI       PI       Comments ---------------------------------------------------------------------- Impression  G1P0. Patient is here for fetal anatomy scan.  On cell-free fetal DNA screening, the risks of aneuploidies  were not increased.  Patient is a carrier of cystic fibrosis mutation.  She had  genetic counseling and partner is being screened for carrier  status.  The results are pending.  We performed fetal anatomy scan. An echogenic intracardiac  focus is seen. No other makers of aneuploidies or fetal  structural defects are seen. Fetal biometry is consistent with   her previously-established dates. Amniotic fluid is normal and  good fetal activity is seen.  I informed the patient that given that she had low rik for fetal  aneuploidies on cell-free fetal DNA screening, finding of  echogenic intracardiac focus should be considered a normal  variant and that the risk of trisomy 21 is not increased. I also  reassured that echogenic focus does not increase the risk of  cardiac defects. I also informed her that only amniocentesis  will give a defintive result on the fetal karyotype.  Patient opted not to have amniocentesis. ---------------------------------------------------------------------- Recommendations  -An appointment was made for her to return in 4 weeks for  completion of fetal anatomy (fetal spine). ----------------------------------------------------------------------                  Noralee Space, MD Electronically Signed Final Report   04/30/2020 04:47 pm ----------------------------------------------------------------------   Assessment and Plan:  Pregnancy: G1P0 at [redacted]w[redacted]d 1. AMA (advanced maternal age) primigravida 35+, unspecified trimester 2. [redacted] weeks gestation of pregnancy 3. Encounter for supervision of normal first pregnancy in second trimester Follow up anatomy scan ordered.  No issues. Preterm labor symptoms and general obstetric precautions including but not limited to vaginal bleeding, contractions, leaking of fluid and fetal movement were reviewed in detail with the patient. Please refer to After Visit Summary for other counseling recommendations.   Return in about 3 weeks (around 06/12/2020) for OFFICE OB VISIT (MD)  6 weeks from now: 2 hr GTT, Tdap, 3rd trimester labs, OFFICE OB VISIT (MD).  Future Appointments  Date Time Provider Department Center  05/28/2020  1:00 PM [facility] NURSE [facility]  05/28/2020  1:15 PM [facility] US2 [facility]    Jaynie Collins, MD

## 2020-05-23 ENCOUNTER — Other Ambulatory Visit: Payer: Self-pay

## 2020-05-23 ENCOUNTER — Emergency Department
Admission: EM | Admit: 2020-05-23 | Discharge: 2020-05-23 | Disposition: A | Discharge status: Home / Self Care | Payer: 59 | Source: Home / Self Care

## 2020-05-23 DIAGNOSIS — M722 Plantar fascial fibromatosis: Secondary | ICD-10-CM | POA: Diagnosis not present

## 2020-05-23 MED ORDER — ACETAMINOPHEN 500 MG PO TABS: 500.0000 mg | ORAL_TABLET | Freq: Four times a day (QID) | ORAL | 0 refills | Status: DC | PRN

## 2020-05-23 NOTE — ED Triage Notes (Signed)
Patient presents to Urgent Care with complaints of right heel pain since 2 mornings ago. Patient reports she woke up with the pain, works on her feet all day, pain is worse when pressure is applied (walking).

## 2020-05-23 NOTE — Discharge Instructions (Addendum)
Heel pain remain off feet as much as possible over the next few days.  Take Tylenol 1 to 2 tablets every 6 hours as needed for foot pain.  Purchase over-the-counter gel sole shoe inserts to help with heel pain. I provided you with a work note indicating that she can return to work day after Sunday.

## 2020-05-23 NOTE — ED Provider Notes (Signed)
[name]    CSN: [id number] Arrival date & time: 05/23/20  1222      History   Chief Complaint Chief Complaint  Patient presents with  . Foot Pain    Right    HPI Elizabeth Perkins is a 35 y.o. female.   HPI Patient, [redacted] weeks pregnant,  presents today with right foot pain localized to the entire right heel.  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  She recently purchased some foot insoles to go in her shoes which has not helped improve pain. She works 3 days per week and this pain improves with rest and is aggravated upon excessive walking. Do to pregnancy status, she has not taken any medication for pain.  Past Medical History:  Diagnosis Date  . Abnormal Pap smear of cervix    HGSIL 2019 Never followed up  . HPV in female   . Vaginal Pap smear, abnormal    ASCUS  . Vitamin D deficiency     Patient Active Problem List   Diagnosis Date Noted  . AMA (advanced maternal age) primigravida 41+, unspecified trimester 04/24/2020  . Cystic fibrosis gene carrier 04/14/2020  . Encounter for supervision of normal pregnancy 02/28/2020  . HGSIL (high grade squamous intraepithelial lesion) on Pap smear of cervix 12/15/2016    Past Surgical History:  Procedure Laterality Date  . COLPOSCOPY      OB History    Gravida  1   Para      Term      Preterm      AB      Living        SAB      IAB      Ectopic      Multiple      Live Births               Home Medications    Prior to Admission medications   Medication Sig Start Date End Date Taking? Authorizing Provider  acetaminophen (TYLENOL) 500 MG tablet Take 1-2 tablets (500-1,000 mg total) by mouth every 6 (six) hours as needed for moderate pain (foot pain). 05/23/20  Yes Bing Neighbors, FNP  Doxylamine-Pyridoxine 10-10 MG TBEC Take 1 tablet by mouth in the morning and at bedtime. Patient not taking: No sig reported  03/24/20   Lesly Dukes, MD  Multiple Vitamin (MULTIVITAMIN) tablet Take 1 tablet by mouth daily. Patient not taking: No sig reported    [provider]  Prenatal Vit-Fe Fumarate-FA (PRENATAL VITAMIN) 27-0.8 MG TABS 1 tab daily 02/28/20   Jerene Bears, MD  SUMAtriptan (IMITREX) 25 MG tablet TAKE 1 TABLET BY MOUTH EVERY 2 HOURS AS NEEDED FOR MIGRAINE. NO MORE THAN 4 TABLETS IN 24 HOURS Patient not taking: No sig reported 03/30/19   [provider]    Family History Family History  Problem Relation Age of Onset  . Healthy Mother   . Healthy Father     Social History Social History   Tobacco Use  . Smoking status: Never Smoker  . Smokeless tobacco: Never Used  Vaping Use  . Vaping Use: Never used  Substance Use Topics  . Alcohol use: Not Currently  . Drug use: Never     Allergies   Patient has no known allergies.   Review of Systems Review of Systems Pertinent negatives listed in HPI   Physical Exam Triage Vital  Signs ED Triage Vitals  Enc Vitals Group     BP 05/23/20 1233 107/69     Pulse Rate 05/23/20 1233 72     Resp 05/23/20 1233 15     Temp 05/23/20 1233 98.8 F (37.1 C)     Temp Source 05/23/20 1233 Oral     SpO2 05/23/20 1233 99 %     Weight --      Height --      Head Circumference --      Peak Flow --      Pain Score 05/23/20 1231 9     Pain Loc --      Pain Edu? --      Excl. in GC? --    No data found.  Updated Vital Signs BP 107/69 (BP Location: Right Arm)   Pulse 72   Temp 98.8 F (37.1 C) (Oral)   Resp 15   LMP 12/19/2019   SpO2 99%   Visual Acuity Right Eye Distance:   Left Eye Distance:   Bilateral Distance:    Right Eye Near:   Left Eye Near:    Bilateral Near:     Physical Exam Constitutional:      Appearance: Normal appearance.  Cardiovascular:     Rate and Rhythm: Normal rate and regular rhythm.     Pulses:          Dorsalis pedis pulses are 2+ on the right side.       Posterior tibial pulses are  2+ on the right side.  Pulmonary:     Effort: Pulmonary effort is normal.     Breath sounds: Normal breath sounds and air entry.  Musculoskeletal:       Feet:  Feet:     Right foot:     Skin integrity: Erythema and warmth present.     Toenail Condition: Right toenails are normal.  Psychiatric:        Behavior: Behavior is cooperative.      UC Treatments / Results  Labs (all labs ordered are listed, but only abnormal results are displayed) Labs Reviewed - No data to display  EKG   Radiology No results found.  Procedures Procedures (including critical care time)  Medications Ordered in UC Medications - No data to display  Initial Impression / Assessment and Plan / UC Course  I have reviewed the triage vital signs and the nursing notes.  Pertinent labs & imaging results that were available during my care of the patient were reviewed by me and considered in my medical decision making (see chart for details).     Purchase GEL in soles to provide additional comfort off load weightbearing of heel. Trial tylenol [id number] mg every 6 hours as needed for pain.  Rest feet and avoid prolong waking or standing over the next 72 hours.  Soak foot in warm Epsom salt. follow-up with PCP if symptoms do not improve. Final Clinical Impressions(s) / UC Diagnoses   Final diagnoses:  Plantar fasciitis     Discharge Instructions     Heel pain remain off feet as much as possible over the next few days.  Take Tylenol 1 to 2 tablets every 6 hours as needed for foot pain.  Purchase over-the-counter gel sole shoe inserts to help with heel pain. I provided you with a work note indicating that she can return to work day after Sunday.   ED Prescriptions    Medication Sig Dispense Auth. Provider   acetaminophen (TYLENOL) 500  MG tablet Take 1-2 tablets (500-1,000 mg total) by mouth every 6 (six) hours as needed for moderate pain (foot pain). 30 tablet Bing Neighbors, FNP     PDMP not  reviewed this encounter.   Bing Neighbors, FNP 05/23/20 2201

## 2020-05-28 ENCOUNTER — Other Ambulatory Visit: Payer: Self-pay

## 2020-05-28 ENCOUNTER — Encounter: Payer: Self-pay | Admitting: *Deleted

## 2020-05-28 ENCOUNTER — Ambulatory Visit: Payer: 59 | Admitting: *Deleted

## 2020-05-28 ENCOUNTER — Ambulatory Visit: Payer: 59 | Attending: Obstetrics and Gynecology

## 2020-05-28 DIAGNOSIS — O09512 Supervision of elderly primigravida, second trimester: Secondary | ICD-10-CM

## 2020-05-28 DIAGNOSIS — Z3A23 23 weeks gestation of pregnancy: Secondary | ICD-10-CM | POA: Diagnosis not present

## 2020-05-28 DIAGNOSIS — Z362 Encounter for other antenatal screening follow-up: Secondary | ICD-10-CM | POA: Insufficient documentation

## 2020-05-28 DIAGNOSIS — Z141 Cystic fibrosis carrier: Secondary | ICD-10-CM | POA: Insufficient documentation

## 2020-05-28 DIAGNOSIS — Z148 Genetic carrier of other disease: Secondary | ICD-10-CM

## 2020-05-28 DIAGNOSIS — O09519 Supervision of elderly primigravida, unspecified trimester: Secondary | ICD-10-CM

## 2020-05-28 DIAGNOSIS — O358XX0 Maternal care for other (suspected) fetal abnormality and damage, not applicable or unspecified: Secondary | ICD-10-CM

## 2020-06-12 ENCOUNTER — Other Ambulatory Visit: Payer: Self-pay

## 2020-06-12 ENCOUNTER — Encounter: Payer: Self-pay | Admitting: Obstetrics & Gynecology

## 2020-06-12 ENCOUNTER — Ambulatory Visit (INDEPENDENT_AMBULATORY_CARE_PROVIDER_SITE_OTHER): Payer: 59 | Admitting: Obstetrics & Gynecology

## 2020-06-12 VITALS — BP 106/59 | HR 86 | Wt 182.0 lb

## 2020-06-12 DIAGNOSIS — Z3A25 25 weeks gestation of pregnancy: Secondary | ICD-10-CM

## 2020-06-12 DIAGNOSIS — Z3490 Encounter for supervision of normal pregnancy, unspecified, unspecified trimester: Secondary | ICD-10-CM

## 2020-06-12 DIAGNOSIS — O09519 Supervision of elderly primigravida, unspecified trimester: Secondary | ICD-10-CM

## 2020-06-12 NOTE — Patient Instructions (Signed)
Return to office for any scheduled appointments. Call the office or go to the MAU at [facility] if:  You begin to have strong, frequent contractions  Your water breaks.  Sometimes it is a big gush of fluid, sometimes it is just a trickle that keeps getting your panties wet or running down your legs  You have vaginal bleeding.  It is normal to have a small amount of spotting if your cervix was checked.   You do not feel your baby moving like normal.  If you do not, get something to eat and drink and lay down and focus on feeling your baby move.   If your baby is still not moving like normal, you should call the office or go to MAU.  Any other obstetric concerns.  TDaP Vaccine Pregnancy Get the Whooping Cough Vaccine While You Are Pregnant (CDC)  It is important for women to get the whooping cough vaccine in the third trimester of each pregnancy. Vaccines are the best way to prevent this disease. There are 2 different whooping cough vaccines. Both vaccines combine protection against whooping cough, tetanus and diphtheria, but they are for different age groups: Tdap: for everyone 11 years or older, including pregnant women  DTaP: for children 2 months through 6 years of age  You need the whooping cough vaccine during each of your pregnancies The recommended time to get the shot is during your 27th through 36th week of pregnancy, preferably during the earlier part of this time period. The Centers for Disease Control and Prevention (CDC) recommends that pregnant women receive the whooping cough vaccine for adolescents and adults (called Tdap vaccine) during the third trimester of each pregnancy. The recommended time to get the shot is during your 27th through 36th week of pregnancy, preferably during the earlier part of this time period. This replaces the original recommendation that pregnant women get the vaccine only if they had not previously received it. The  American College of Obstetricians and Gynecologists and the American College of Nurse-Midwives support this recommendation.  You should get the whooping cough vaccine while pregnant to pass protection to your baby frame support disabled and/or not supported in this browser  Learn why Laura decided to get the whooping cough vaccine in her 3rd trimester of pregnancy and how her baby girl was born with some protection against the disease. Also available on YouTube. After receiving the whooping cough vaccine, your body will create protective antibodies (proteins produced by the body to fight off diseases) and pass some of them to your baby before birth. These antibodies provide your baby some short-term protection against whooping cough in early life. These antibodies can also protect your baby from some of the more serious complications that come along with whooping cough. Your protective antibodies are at their highest about 2 weeks after getting the vaccine, but it takes time to pass them to your baby. So the preferred time to get the whooping cough vaccine is early in your third trimester. The amount of whooping cough antibodies in your body decreases over time. That is why CDC recommends you get a whooping cough vaccine during each pregnancy. Doing so allows each of your babies to get the greatest number of protective antibodies from you. This means each of your babies will get the best protection possible against this disease.  Getting the whooping cough vaccine while pregnant is better than getting the vaccine after you give birth Whooping cough vaccination during   pregnancy is ideal so your baby will have short-term protection as soon as he is born. This early protection is important because your baby will not start getting his whooping cough vaccines until he is 2 months old. These first few months of life are when your baby is at greatest risk for catching whooping cough. This is also when he's at  greatest risk for having severe, potentially life-threating complications from the infection. To avoid that gap in protection, it is best to get a whooping cough vaccine during pregnancy. You will then pass protection to your baby before he is born. To continue protecting your baby, he should get whooping cough vaccines starting at 2 months old. You may never have gotten the Tdap vaccine before and did not get it during this pregnancy. If so, you should make sure to get the vaccine immediately after you give birth, before leaving the hospital or birthing center. It will take about 2 weeks before your body develops protection (antibodies) in response to the vaccine. Once you have protection from the vaccine, you are less likely to give whooping cough to your newborn while caring for him. But remember, your baby will still be at risk for catching whooping cough from others. A recent study looked to see how effective Tdap was at preventing whooping cough in babies whose mothers got the vaccine while pregnant or in the hospital after giving birth. The study found that getting Tdap between 27 through 36 weeks of pregnancy is 85% more effective at preventing whooping cough in babies younger than 2 months old. Blood tests cannot tell if you need a whooping cough vaccine There are no blood tests that can tell you if you have enough antibodies in your body to protect yourself or your baby against whooping cough. Even if you have been sick with whooping cough in the past or previously received the vaccine, you still should get the vaccine during each pregnancy. Breastfeeding may pass some protective antibodies onto your baby By breastfeeding, you may pass some antibodies you have made in response to the vaccine to your baby. When you get a whooping cough vaccine during your pregnancy, you will have antibodies in your breast milk that you can share with your baby as soon as your milk comes in. However, your baby will not  get protective antibodies immediately if you wait to get the whooping cough vaccine until after delivering your baby. This is because it takes about 2 weeks for your body to create antibodies. Learn more about the health benefits of breastfeeding.  

## 2020-06-12 NOTE — Progress Notes (Signed)
   PRENATAL VISIT NOTE  Subjective:  Elizabeth Perkins is a 35 y.o. G1P0 at [redacted]w[redacted]d being seen today for ongoing prenatal care.  She is currently monitored for the following issues for this low-risk pregnancy and has HGSIL (high grade squamous intraepithelial lesion) on Pap smear of cervix; Encounter for supervision of normal pregnancy; Cystic fibrosis gene carrier; and AMA (advanced maternal age) primigravida 35+, unspecified trimester on their problem list.  Patient reports no complaints.  Contractions: Not present. Vag. Bleeding: None.  Movement: Present. Denies leaking of fluid.   The following portions of the patient's history were reviewed and updated as appropriate: allergies, current medications, past family history, past medical history, past social history, past surgical history and problem list.   Objective:   Vitals:   06/12/20 1439  BP: (!) 106/59  Pulse: 86  Weight: 182 lb (82.6 kg)    Fetal Status: Fetal Heart Rate (bpm): 141 Fundal Height: 25 cm Movement: Present     General:  Alert, oriented and cooperative. Patient is in no acute distress.  Skin: Skin is warm and dry. No rash noted.   Cardiovascular: Normal heart rate noted  Respiratory: Normal respiratory effort, no problems with respiration noted  Abdomen: Soft, gravid, appropriate for gestational age.  Pain/Pressure: Absent     Pelvic: Cervical exam deferred        Extremities: Normal range of motion.  Edema: None  Mental Status: Normal mood and affect. Normal behavior. Normal judgment and thought content.   Assessment and Plan:  Pregnancy: G1P0 at [redacted]w[redacted]d 1. [redacted] weeks gestation of pregnancy 2. AMA (advanced maternal age) primigravida 35+, unspecified trimester 3. Encounter for supervision of normal pregnancy, antepartum, unspecified gravidity Third trimester labs next visit. Preterm labor symptoms and general obstetric precautions including but not limited to vaginal bleeding, contractions, leaking of fluid and  fetal movement were reviewed in detail with the patient. Please refer to After Visit Summary for other counseling recommendations.   Return in about 3 weeks (around 07/03/2020) for 2 hr GTT, 3rd trimester labs, TDap, OFFICE OB VISIT (MD only).  Future Appointments  Date Time Provider Department Center  07/03/2020  9:00 AM Constant, Gigi Gin, MD [facility]    Jaynie Collins, MD

## 2020-06-16 ENCOUNTER — Ambulatory Visit: Payer: 59

## 2020-07-03 ENCOUNTER — Other Ambulatory Visit: Payer: Self-pay

## 2020-07-03 ENCOUNTER — Ambulatory Visit (INDEPENDENT_AMBULATORY_CARE_PROVIDER_SITE_OTHER): Payer: 59 | Admitting: Obstetrics and Gynecology

## 2020-07-03 ENCOUNTER — Encounter: Payer: Self-pay | Admitting: Obstetrics and Gynecology

## 2020-07-03 VITALS — BP 98/55 | HR 86 | Wt 186.0 lb

## 2020-07-03 DIAGNOSIS — Z3403 Encounter for supervision of normal first pregnancy, third trimester: Secondary | ICD-10-CM

## 2020-07-03 DIAGNOSIS — Z23 Encounter for immunization: Secondary | ICD-10-CM | POA: Diagnosis not present

## 2020-07-03 DIAGNOSIS — O09519 Supervision of elderly primigravida, unspecified trimester: Secondary | ICD-10-CM

## 2020-07-03 NOTE — Progress Notes (Signed)
   PRENATAL VISIT NOTE  Subjective:  Elizabeth Perkins is a 35 y.o. G1P0 at [redacted]w[redacted]d being seen today for ongoing prenatal care.  She is currently monitored for the following issues for this low-risk pregnancy and has HGSIL (high grade squamous intraepithelial lesion) on Pap smear of cervix; Encounter for supervision of normal pregnancy; Cystic fibrosis gene carrier; and AMA (advanced maternal age) primigravida 35+, unspecified trimester on their problem list.  Patient reports no complaints.  Contractions: Not present. Vag. Bleeding: None.  Movement: Present. Denies leaking of fluid.   The following portions of the patient's history were reviewed and updated as appropriate: allergies, current medications, past family history, past medical history, past social history, past surgical history and problem list.   Objective:   Vitals:   07/03/20 0855  BP: (!) 98/55  Pulse: 86  Weight: 186 lb (84.4 kg)    Fetal Status: Fetal Heart Rate (bpm): 140 Fundal Height: 28 cm Movement: Present     General:  Alert, oriented and cooperative. Patient is in no acute distress.  Skin: Skin is warm and dry. No rash noted.   Cardiovascular: Normal heart rate noted  Respiratory: Normal respiratory effort, no problems with respiration noted  Abdomen: Soft, gravid, appropriate for gestational age.  Pain/Pressure: Absent     Pelvic: Cervical exam deferred        Extremities: Normal range of motion.  Edema: None  Mental Status: Normal mood and affect. Normal behavior. Normal judgment and thought content.   Assessment and Plan:  Pregnancy: G1P0 at [redacted]w[redacted]d 1. Encounter for supervision of normal first pregnancy in third trimester Patient is doing well  Third trimester labs and glucola today Patient undecided on contraception Patient has a pediatrician in mind  2. AMA (advanced maternal age) primigravida 35+, unspecified trimester   Preterm labor symptoms and general obstetric precautions including but not limited  to vaginal bleeding, contractions, leaking of fluid and fetal movement were reviewed in detail with the patient. Please refer to After Visit Summary for other counseling recommendations.   Return in about 2 weeks (around 07/17/2020) for in person, ROB, Low risk.  No future appointments.  Catalina Antigua, MD

## 2020-07-04 LAB — RPR: RPR Ser Ql: NONREACTIVE

## 2020-07-04 LAB — CBC
HCT: 32.8 % — ABNORMAL LOW (ref 35.0–45.0)
Hemoglobin: 10.8 g/dL — ABNORMAL LOW (ref 11.7–15.5)
MCH: 27.6 pg (ref 27.0–33.0)
MCHC: 32.9 g/dL (ref 32.0–36.0)
MCV: 83.9 fL (ref 80.0–100.0)
MPV: 10 fL (ref 7.5–12.5)
Platelets: 346 10*3/uL (ref 140–400)
RBC: 3.91 10*6/uL (ref 3.80–5.10)
RDW: 13.9 % (ref 11.0–15.0)
WBC: 10.5 10*3/uL (ref 3.8–10.8)

## 2020-07-04 LAB — HIV ANTIBODY (ROUTINE TESTING W REFLEX): HIV 1&2 Ab, 4th Generation: NONREACTIVE

## 2020-07-04 LAB — 2HR GTT W 1 HR, CARPENTER, 75 G
Glucose, 1 Hr, Gest: 141 mg/dL (ref 65–179)
Glucose, 2 Hr, Gest: 101 mg/dL (ref 65–152)
Glucose, Fasting, Gest: 78 mg/dL (ref 65–91)

## 2020-07-17 ENCOUNTER — Other Ambulatory Visit: Payer: Self-pay

## 2020-07-17 ENCOUNTER — Encounter: Payer: Self-pay | Admitting: Obstetrics and Gynecology

## 2020-07-17 ENCOUNTER — Ambulatory Visit (INDEPENDENT_AMBULATORY_CARE_PROVIDER_SITE_OTHER): Payer: 59 | Admitting: Obstetrics and Gynecology

## 2020-07-17 VITALS — BP 109/58 | HR 83 | Wt 188.0 lb

## 2020-07-17 DIAGNOSIS — Z3403 Encounter for supervision of normal first pregnancy, third trimester: Secondary | ICD-10-CM

## 2020-07-17 DIAGNOSIS — Z3A30 30 weeks gestation of pregnancy: Secondary | ICD-10-CM

## 2020-07-17 DIAGNOSIS — R87613 High grade squamous intraepithelial lesion on cytologic smear of cervix (HGSIL): Secondary | ICD-10-CM

## 2020-07-17 DIAGNOSIS — O09519 Supervision of elderly primigravida, unspecified trimester: Secondary | ICD-10-CM

## 2020-07-17 NOTE — Progress Notes (Signed)
   PRENATAL VISIT NOTE  Subjective:  Elizabeth Perkins is a 35 y.o. G1P0 at [redacted]w[redacted]d being seen today for ongoing prenatal care.  She is currently monitored for the following issues for this low-risk pregnancy and has HGSIL (high grade squamous intraepithelial lesion) on Pap smear of cervix; Encounter for supervision of normal pregnancy; Cystic fibrosis gene carrier; and AMA (advanced maternal age) primigravida 35+, unspecified trimester on their problem list.  Patient reports no complaints.  Contractions: Irritability. Vag. Bleeding: None.  Movement: Present. Denies leaking of fluid.   The following portions of the patient's history were reviewed and updated as appropriate: allergies, current medications, past family history, past medical history, past social history, past surgical history and problem list.   Objective:   Vitals:   07/17/20 1358  BP: (!) 109/58  Pulse: 83  Weight: 188 lb (85.3 kg)    Fetal Status: Fetal Heart Rate (bpm): 141   Movement: Present     General:  Alert, oriented and cooperative. Patient is in no acute distress.  Skin: Skin is warm and dry. No rash noted.   Cardiovascular: Normal heart rate noted  Respiratory: Normal respiratory effort, no problems with respiration noted  Abdomen: Soft, gravid, appropriate for gestational age.  Pain/Pressure: Absent     Pelvic: Cervical exam deferred        Extremities: Normal range of motion.  Edema: None  Mental Status: Normal mood and affect. Normal behavior. Normal judgment and thought content.   Assessment and Plan:  Pregnancy: G1P0 at [redacted]w[redacted]d 1. Encounter for supervision of normal first pregnancy in third trimester Would like to breastfeed Doing well  2. HGSIL (high grade squamous intraepithelial lesion) on Pap smear of cervix Needs colpo post partum  3. AMA (advanced maternal age) primigravida 35+, unspecified trimester  4. [redacted] weeks gestation of pregnancy  Preterm labor symptoms and general obstetric precautions  including but not limited to vaginal bleeding, contractions, leaking of fluid and fetal movement were reviewed in detail with the patient. Please refer to After Visit Summary for other counseling recommendations.   Return in about 2 weeks (around 07/31/2020) for low OB, in person.  No future appointments.  Conan Bowens, MD

## 2020-07-29 ENCOUNTER — Ambulatory Visit (INDEPENDENT_AMBULATORY_CARE_PROVIDER_SITE_OTHER): Payer: 59 | Admitting: Obstetrics and Gynecology

## 2020-07-29 ENCOUNTER — Other Ambulatory Visit: Payer: Self-pay

## 2020-07-29 VITALS — BP 96/63 | HR 83 | Wt 186.0 lb

## 2020-07-29 DIAGNOSIS — Z3A31 31 weeks gestation of pregnancy: Secondary | ICD-10-CM

## 2020-07-29 DIAGNOSIS — Z3403 Encounter for supervision of normal first pregnancy, third trimester: Secondary | ICD-10-CM

## 2020-07-29 NOTE — Progress Notes (Signed)
   PRENATAL VISIT NOTE  Subjective:  Elizabeth Perkins is a 35 y.o. G1P0 at [redacted]w[redacted]d being seen today for ongoing prenatal care.  She is currently monitored for the following issues for this low-risk pregnancy and has HGSIL (high grade squamous intraepithelial lesion) on Pap smear of cervix; Encounter for supervision of normal pregnancy; Cystic fibrosis gene carrier; and AMA (advanced maternal age) primigravida 35+, unspecified trimester on their problem list.  Patient reports no complaints.  Contractions: Not present. Vag. Bleeding: None.  Movement: Present. Denies leaking of fluid.   The following portions of the patient's history were reviewed and updated as appropriate: allergies, current medications, past family history, past medical history, past social history, past surgical history and problem list.   Objective:   Vitals:   07/29/20 0928  BP: 96/63  Pulse: 83  Weight: 186 lb (84.4 kg)    Fetal Status: Fetal Heart Rate (bpm): 135 Fundal Height: 32 cm Movement: Present     General:  Alert, oriented and cooperative. Patient is in no acute distress.  Skin: Skin is warm and dry. No rash noted.   Cardiovascular: Normal heart rate noted  Respiratory: Normal respiratory effort, no problems with respiration noted  Abdomen: Soft, gravid, appropriate for gestational age.  Pain/Pressure: Absent     Pelvic: Cervical exam deferred        Extremities: Normal range of motion.  Edema: None  Mental Status: Normal mood and affect. Normal behavior. Normal judgment and thought content.   Assessment and Plan:  Pregnancy: G1P0 at [redacted]w[redacted]d 1. [redacted] weeks gestation of pregnancy  Reviewed [facility] and classes offered Doing well Decided on pediatrician- updated problem list.  Preterm labor symptoms and general obstetric precautions including but not limited to vaginal bleeding, contractions, leaking of fluid and fetal movement were reviewed in detail with the patient. Please refer to After Visit  Summary for other counseling recommendations.   No follow-ups on file.  No future appointments.  Venia Carbon, NP

## 2020-08-12 ENCOUNTER — Telehealth: Payer: Self-pay | Admitting: *Deleted

## 2020-08-12 NOTE — Telephone Encounter (Signed)
Left patient an urgent message to call the office or MyChart message if she can move her appointment up to 10:10 on 08/13/2020.

## 2020-08-13 ENCOUNTER — Other Ambulatory Visit: Payer: Self-pay

## 2020-08-13 ENCOUNTER — Ambulatory Visit (INDEPENDENT_AMBULATORY_CARE_PROVIDER_SITE_OTHER): Payer: 59 | Admitting: Certified Nurse Midwife

## 2020-08-13 VITALS — BP 104/62 | HR 95 | Wt 188.0 lb

## 2020-08-13 DIAGNOSIS — Z3403 Encounter for supervision of normal first pregnancy, third trimester: Secondary | ICD-10-CM

## 2020-08-13 DIAGNOSIS — Z3A34 34 weeks gestation of pregnancy: Secondary | ICD-10-CM

## 2020-08-13 NOTE — Progress Notes (Signed)
Subjective:  Elizabeth Perkins is a 35 y.o. G1P0 at [redacted]w[redacted]d being seen today for ongoing prenatal care.  She is currently monitored for the following issues for this low-risk pregnancy and has HGSIL (high grade squamous intraepithelial lesion) on Pap smear of cervix; Encounter for supervision of normal pregnancy; Cystic fibrosis gene carrier; and AMA (advanced maternal age) primigravida 35+, unspecified trimester on their problem list.  Patient reports no complaints.  Contractions: Not present. Vag. Bleeding: None.  Movement: Present. Denies leaking of fluid.   The following portions of the patient's history were reviewed and updated as appropriate: allergies, current medications, past family history, past medical history, past social history, past surgical history and problem list. Problem list updated.  Objective:   Vitals:   08/13/20 1026  BP: 104/62  Pulse: 95  Weight: 188 lb (85.3 kg)    Fetal Status: Fetal Heart Rate (bpm): 152 Fundal Height: 34 cm Movement: Present  Presentation: Vertex  General:  Alert, oriented and cooperative. Patient is in no acute distress.  Skin: Skin is warm and dry. No rash noted.   Cardiovascular: Normal heart rate noted  Respiratory: Normal respiratory effort, no problems with respiration noted  Abdomen: Soft, gravid, appropriate for gestational age. Pain/Pressure: Absent     Pelvic: Vag. Bleeding: None Vag D/C Character: Thin   Cervical exam deferred        Extremities: Normal range of motion.  Edema: None  Mental Status: Normal mood and affect. Normal behavior. Normal judgment and thought content.   Urinalysis:      Assessment and Plan:  Pregnancy: G1P0 at [redacted]w[redacted]d  1. Encounter for supervision of normal first pregnancy in third trimester  2. [redacted] weeks gestation of pregnancy   Preterm labor symptoms and general obstetric precautions including but not limited to vaginal bleeding, contractions, leaking of fluid and fetal movement were reviewed in detail  with the patient. Please refer to After Visit Summary for other counseling recommendations.  Return in about 2 weeks (around 08/27/2020).   Donette Larry, CNM

## 2020-08-22 ENCOUNTER — Encounter (INDEPENDENT_AMBULATORY_CARE_PROVIDER_SITE_OTHER): Payer: Self-pay

## 2020-08-27 ENCOUNTER — Other Ambulatory Visit (HOSPITAL_COMMUNITY)
Admission: RE | Admit: 2020-08-27 | Discharge: 2020-08-27 | Disposition: A | Discharge status: Home / Self Care | Payer: 59 | Source: Ambulatory Visit | Attending: Obstetrics & Gynecology | Admitting: Obstetrics & Gynecology

## 2020-08-27 ENCOUNTER — Ambulatory Visit (INDEPENDENT_AMBULATORY_CARE_PROVIDER_SITE_OTHER): Payer: 59 | Admitting: Obstetrics & Gynecology

## 2020-08-27 ENCOUNTER — Other Ambulatory Visit: Payer: Self-pay

## 2020-08-27 VITALS — BP 107/66 | HR 95 | Wt 181.0 lb

## 2020-08-27 DIAGNOSIS — Z3403 Encounter for supervision of normal first pregnancy, third trimester: Secondary | ICD-10-CM | POA: Insufficient documentation

## 2020-08-27 LAB — OB RESULTS CONSOLE GBS: GBS: NEGATIVE

## 2020-08-27 NOTE — Progress Notes (Signed)
   PRENATAL VISIT NOTE  Subjective:  Elizabeth Perkins is a 35 y.o. G1P0 at [redacted]w[redacted]d being seen today for ongoing prenatal care.  She is currently monitored for the following issues for this low-risk pregnancy and has HGSIL (high grade squamous intraepithelial lesion) on Pap smear of cervix; Encounter for supervision of normal pregnancy; Cystic fibrosis gene carrier; and AMA (advanced maternal age) primigravida 35+, unspecified trimester on their problem list.  Patient reports no complaints.  Contractions: Not present. Vag. Bleeding: None.  Movement: Present. Denies leaking of fluid.   The following portions of the patient's history were reviewed and updated as appropriate: allergies, current medications, past family history, past medical history, past social history, past surgical history and problem list.   Objective:   Vitals:   08/27/20 1030  BP: 107/66  Pulse: 95  Weight: 181 lb (82.1 kg)    Fetal Status:     Movement: Present     General:  Alert, oriented and cooperative. Patient is in no acute distress.  Skin: Skin is warm and dry. No rash noted.   Cardiovascular: Normal heart rate noted  Respiratory: Normal respiratory effort, no problems with respiration noted  Abdomen: Soft, gravid, appropriate for gestational age.  Pain/Pressure: Absent     Pelvic: Cervical exam performed in the presence of a chaperone      closed, posterior  Extremities: Normal range of motion.  Edema: None  Mental Status: Normal mood and affect. Normal behavior. Normal judgment and thought content.   Assessment and Plan:  Pregnancy: G1P0 at [redacted]w[redacted]d 1. Encounter for supervision of normal first pregnancy in third trimester - Culture, beta strep (group b only) - Cervicovaginal ancillary only( [place])  Term labor symptoms and general obstetric precautions including but not limited to vaginal bleeding, contractions, leaking of fluid and fetal movement were reviewed in detail with the patient. Please refer  to After Visit Summary for other counseling recommendations.   RTC 2 weeks Take BP weekly and enter in Baby Rx (all are normal and crossing into chart)  No future appointments.  Elsie Lincoln, MD

## 2020-08-28 LAB — CERVICOVAGINAL ANCILLARY ONLY
Chlamydia: NEGATIVE
Comment: NEGATIVE
Comment: NORMAL
Neisseria Gonorrhea: NEGATIVE

## 2020-08-30 LAB — CULTURE, BETA STREP (GROUP B ONLY)
MICRO NUMBER:: 12114210
SPECIMEN QUALITY:: ADEQUATE

## 2020-09-10 ENCOUNTER — Ambulatory Visit (INDEPENDENT_AMBULATORY_CARE_PROVIDER_SITE_OTHER): Payer: 59 | Admitting: Obstetrics and Gynecology

## 2020-09-10 ENCOUNTER — Other Ambulatory Visit: Payer: Self-pay

## 2020-09-10 VITALS — BP 102/63 | HR 86 | Wt 192.0 lb

## 2020-09-10 DIAGNOSIS — Z3A38 38 weeks gestation of pregnancy: Secondary | ICD-10-CM

## 2020-09-10 DIAGNOSIS — Z3403 Encounter for supervision of normal first pregnancy, third trimester: Secondary | ICD-10-CM

## 2020-09-10 NOTE — Progress Notes (Signed)
   PRENATAL VISIT NOTE  Subjective:  Elizabeth Perkins is a 35 y.o. G1P0 at [redacted]w[redacted]d being seen today for ongoing prenatal care.  She is currently monitored for the following issues for this low-risk pregnancy and has HGSIL (high grade squamous intraepithelial lesion) on Pap smear of cervix; Encounter for supervision of normal pregnancy; Cystic fibrosis gene carrier; and AMA (advanced maternal age) primigravida 35+, unspecified trimester on their problem list.  Patient reports no complaints.  Contractions: Not present. Vag. Bleeding: None.  Movement: Present. Denies leaking of fluid.   The following portions of the patient's history were reviewed and updated as appropriate: allergies, current medications, past family history, past medical history, past social history, past surgical history and problem list.   Objective:   Vitals:   09/10/20 1028  BP: 102/63  Pulse: 86  Weight: 192 lb (87.1 kg)    Fetal Status: Fetal Heart Rate (bpm): 138 Fundal Height: 38 cm Movement: Present     General:  Alert, oriented and cooperative. Patient is in no acute distress.  Skin: Skin is warm and dry. No rash noted.   Cardiovascular: Normal heart rate noted  Respiratory: Normal respiratory effort, no problems with respiration noted  Abdomen: Soft, gravid, appropriate for gestational age.  Pain/Pressure: Present     Pelvic: Cervical exam deferred        Extremities: Normal range of motion.  Edema: None  Mental Status: Normal mood and affect. Normal behavior. Normal judgment and thought content.   Assessment and Plan:  Pregnancy: G1P0 at [redacted]w[redacted]d  1. Encounter for supervision of normal first pregnancy in third trimester  GBS negative ? Cervical check next week if patient is comfortable with that    Term labor symptoms and general obstetric precautions including but not limited to vaginal bleeding, contractions, leaking of fluid and fetal movement were reviewed in detail with the patient. Please refer to  After Visit Summary for other counseling recommendations.   No follow-ups on file.  Future Appointments  Date Time Provider Department Center  09/18/2020  8:30 AM Constant, Gigi Gin, MD [facility]    Venia Carbon, NP

## 2020-09-15 DIAGNOSIS — Z3482 Encounter for supervision of other normal pregnancy, second trimester: Secondary | ICD-10-CM | POA: Diagnosis not present

## 2020-09-15 DIAGNOSIS — R609 Edema, unspecified: Secondary | ICD-10-CM | POA: Diagnosis not present

## 2020-09-15 DIAGNOSIS — Z3483 Encounter for supervision of other normal pregnancy, third trimester: Secondary | ICD-10-CM | POA: Diagnosis not present

## 2020-09-18 ENCOUNTER — Other Ambulatory Visit: Payer: Self-pay

## 2020-09-18 ENCOUNTER — Ambulatory Visit (INDEPENDENT_AMBULATORY_CARE_PROVIDER_SITE_OTHER): Payer: 59 | Admitting: Obstetrics and Gynecology

## 2020-09-18 ENCOUNTER — Encounter: Payer: Self-pay | Admitting: Obstetrics and Gynecology

## 2020-09-18 VITALS — BP 103/61 | HR 85 | Wt 194.0 lb

## 2020-09-18 DIAGNOSIS — Z3403 Encounter for supervision of normal first pregnancy, third trimester: Secondary | ICD-10-CM

## 2020-09-18 DIAGNOSIS — O09519 Supervision of elderly primigravida, unspecified trimester: Secondary | ICD-10-CM

## 2020-09-18 NOTE — Progress Notes (Signed)
   PRENATAL VISIT NOTE  Subjective:  Elizabeth Perkins is a 35 y.o. G1P0 at [redacted]w[redacted]d being seen today for ongoing prenatal care.  She is currently monitored for the following issues for this low-risk pregnancy and has HGSIL (high grade squamous intraepithelial lesion) on Pap smear of cervix; Encounter for supervision of normal pregnancy; Cystic fibrosis gene carrier; and AMA (advanced maternal age) primigravida 35+, unspecified trimester on their problem list.  Patient reports no complaints.  Contractions: Irritability. Vag. Bleeding: None.  Movement: Present. Denies leaking of fluid.   The following portions of the patient's history were reviewed and updated as appropriate: allergies, current medications, past family history, past medical history, past social history, past surgical history and problem list.   Objective:   Vitals:   09/18/20 0821  BP: 103/61  Pulse: 85  Weight: 194 lb (88 kg)    Fetal Status: Fetal Heart Rate (bpm): 132 Fundal Height: 38 cm Movement: Present  Presentation: Vertex  General:  Alert, oriented and cooperative. Patient is in no acute distress.  Skin: Skin is warm and dry. No rash noted.   Cardiovascular: Normal heart rate noted  Respiratory: Normal respiratory effort, no problems with respiration noted  Abdomen: Soft, gravid, appropriate for gestational age.  Pain/Pressure: Present     Pelvic: Cervical exam performed in the presence of a chaperone Dilation: Closed Effacement (%): Thick Station: Ballotable  Extremities: Normal range of motion.  Edema: None  Mental Status: Normal mood and affect. Normal behavior. Normal judgment and thought content.   Assessment and Plan:  Pregnancy: G1P0 at [redacted]w[redacted]d 1. Encounter for supervision of normal first pregnancy in third trimester Patient is doing well without complaints Discussed IOL and expectations with process and labor & delivery Patient was unaware of IOL scheduled at 40 weeks and is interested in waiting for SOL.  Will plan for IOL at 41 weeks with postdate testing ordered for next week  2. AMA (advanced maternal age) primigravida 35+, unspecified trimester   Term labor symptoms and general obstetric precautions including but not limited to vaginal bleeding, contractions, leaking of fluid and fetal movement were reviewed in detail with the patient. Please refer to After Visit Summary for other counseling recommendations.   Return in about 1 week (around 09/25/2020) for in person, ROB, Low risk.  Future Appointments  Date Time Provider Department Center  10/01/2020  9:10 AM [facility] SCHED ROOM [facility] None    Catalina Antigua, MD

## 2020-09-22 ENCOUNTER — Other Ambulatory Visit: Payer: Self-pay

## 2020-09-22 ENCOUNTER — Ambulatory Visit (INDEPENDENT_AMBULATORY_CARE_PROVIDER_SITE_OTHER): Payer: 59

## 2020-09-22 ENCOUNTER — Ambulatory Visit: Payer: 59 | Admitting: *Deleted

## 2020-09-22 VITALS — BP 115/66 | HR 69

## 2020-09-22 DIAGNOSIS — O09519 Supervision of elderly primigravida, unspecified trimester: Secondary | ICD-10-CM

## 2020-09-22 NOTE — Progress Notes (Signed)

## 2020-09-24 ENCOUNTER — Telehealth (HOSPITAL_COMMUNITY): Payer: Self-pay | Admitting: *Deleted

## 2020-09-24 NOTE — Telephone Encounter (Signed)
Preadmission screen  

## 2020-09-25 ENCOUNTER — Telehealth (HOSPITAL_COMMUNITY): Payer: Self-pay | Admitting: *Deleted

## 2020-09-25 ENCOUNTER — Ambulatory Visit (INDEPENDENT_AMBULATORY_CARE_PROVIDER_SITE_OTHER): Payer: 59 | Admitting: Obstetrics and Gynecology

## 2020-09-25 ENCOUNTER — Encounter (HOSPITAL_COMMUNITY): Payer: Self-pay

## 2020-09-25 ENCOUNTER — Encounter: Payer: Self-pay | Admitting: Obstetrics and Gynecology

## 2020-09-25 ENCOUNTER — Other Ambulatory Visit: Payer: Self-pay

## 2020-09-25 ENCOUNTER — Encounter (HOSPITAL_COMMUNITY): Payer: Self-pay | Admitting: *Deleted

## 2020-09-25 VITALS — BP 114/56 | HR 78 | Wt 197.0 lb

## 2020-09-25 DIAGNOSIS — R87613 High grade squamous intraepithelial lesion on cytologic smear of cervix (HGSIL): Secondary | ICD-10-CM

## 2020-09-25 DIAGNOSIS — O09519 Supervision of elderly primigravida, unspecified trimester: Secondary | ICD-10-CM

## 2020-09-25 DIAGNOSIS — Z3403 Encounter for supervision of normal first pregnancy, third trimester: Secondary | ICD-10-CM

## 2020-09-25 NOTE — Telephone Encounter (Signed)
Preadmission screen  

## 2020-09-25 NOTE — Progress Notes (Signed)
   PRENATAL VISIT NOTE  Subjective:  Elizabeth Perkins is a 35 y.o. G1P0 at [redacted]w[redacted]d being seen today for ongoing prenatal care.  She is currently monitored for the following issues for this low-risk pregnancy and has HGSIL (high grade squamous intraepithelial lesion) on Pap smear of cervix; Encounter for supervision of normal pregnancy; Cystic fibrosis gene carrier; and AMA (advanced maternal age) primigravida 35+, unspecified trimester on their problem list.  Patient reports no complaints.  Contractions: Irritability. Vag. Bleeding: None.  Movement: Present. Denies leaking of fluid.   The following portions of the patient's history were reviewed and updated as appropriate: allergies, current medications, past family history, past medical history, past social history, past surgical history and problem list.   Objective:   Vitals:   09/25/20 1532  BP: (!) 114/56  Pulse: 78  Weight: 197 lb (89.4 kg)    Fetal Status: Fetal Heart Rate (bpm): 143   Movement: Present     General:  Alert, oriented and cooperative. Patient is in no acute distress.  Skin: Skin is warm and dry. No rash noted.   Cardiovascular: Normal heart rate noted  Respiratory: Normal respiratory effort, no problems with respiration noted  Abdomen: Soft, gravid, appropriate for gestational age.  Pain/Pressure: Present     Pelvic: Cervical exam performed in the presence of a chaperone        Extremities: Normal range of motion.  Edema: None  Mental Status: Normal mood and affect. Normal behavior. Normal judgment and thought content.   Assessment and Plan:  Pregnancy: G1P0 at [redacted]w[redacted]d 1. Encounter for supervision of normal first pregnancy in third trimester - Requested IOL moved to 8/18 due to social reasons. Reviewed risks of PD pregnancy. Discussed [place] next week may also dictate earlier delivery and to be prepared for that possibility. Discussed she may also go into labor and deliver on the 17th. Offered earlier date. They do  prefer 8/18. Last BPP on 8/8 was 8/8, normal AFI.  - f/u in one week  2. HGSIL (high grade squamous intraepithelial lesion) on Pap smear of cervix - Postpartum colpo  3. AMA (advanced maternal age) primigravida 35+, unspecified trimester   Term labor symptoms and general obstetric precautions including but not limited to vaginal bleeding, contractions, leaking of fluid and fetal movement were reviewed in detail with the patient. Please refer to After Visit Summary for other counseling recommendations.   No follow-ups on file.  Future Appointments  Date Time Provider Department Center  09/29/2020  3:15 PM [facility]  10/01/2020  9:10 AM [facility] SCHED ROOM [facility] None    Milas Hock, MD

## 2020-09-26 ENCOUNTER — Telehealth (HOSPITAL_COMMUNITY): Payer: Self-pay | Admitting: *Deleted

## 2020-09-26 NOTE — Telephone Encounter (Signed)
Preadmission screen  

## 2020-09-29 ENCOUNTER — Other Ambulatory Visit: Payer: Self-pay | Admitting: Obstetrics & Gynecology

## 2020-09-29 ENCOUNTER — Other Ambulatory Visit: Payer: Self-pay

## 2020-09-29 ENCOUNTER — Ambulatory Visit (INDEPENDENT_AMBULATORY_CARE_PROVIDER_SITE_OTHER): Payer: 59

## 2020-09-29 ENCOUNTER — Ambulatory Visit: Payer: 59 | Admitting: *Deleted

## 2020-09-29 VITALS — BP 118/65 | HR 76

## 2020-09-29 DIAGNOSIS — O480 Post-term pregnancy: Secondary | ICD-10-CM

## 2020-09-29 NOTE — Progress Notes (Signed)
Pt informed that the ultrasound is considered a limited OB ultrasound and is not intended to be a complete ultrasound exam.  Patient also informed that the ultrasound is not being completed with the intent of assessing for fetal or placental anomalies or any pelvic abnormalities.  Explained that the purpose of today's ultrasound is to assess for presentation, BPP and amniotic fluid volume.  Patient acknowledges the purpose of the exam and the limitations of the study.  IOL scheduled on 8/18

## 2020-09-30 ENCOUNTER — Ambulatory Visit (INDEPENDENT_AMBULATORY_CARE_PROVIDER_SITE_OTHER): Payer: 59

## 2020-09-30 VITALS — BP 117/64 | HR 92 | Wt 196.0 lb

## 2020-09-30 DIAGNOSIS — O09519 Supervision of elderly primigravida, unspecified trimester: Secondary | ICD-10-CM

## 2020-09-30 DIAGNOSIS — Z3A40 40 weeks gestation of pregnancy: Secondary | ICD-10-CM

## 2020-09-30 DIAGNOSIS — R87613 High grade squamous intraepithelial lesion on cytologic smear of cervix (HGSIL): Secondary | ICD-10-CM

## 2020-09-30 DIAGNOSIS — Z3403 Encounter for supervision of normal first pregnancy, third trimester: Secondary | ICD-10-CM

## 2020-09-30 LAB — SARS CORONAVIRUS 2 (TAT 6-24 HRS): SARS Coronavirus 2: NEGATIVE

## 2020-09-30 NOTE — Progress Notes (Signed)
   PRENATAL VISIT NOTE  Subjective:  Elizabeth Perkins is a 35 y.o. G1P0 at [redacted]w[redacted]d being seen today for ongoing prenatal care.  She is currently monitored for the following issues for this low-risk pregnancy and has HGSIL (high grade squamous intraepithelial lesion) on Pap smear of cervix; Encounter for supervision of normal pregnancy; Cystic fibrosis gene carrier; and AMA (advanced maternal age) primigravida 35+, unspecified trimester on their problem list.  Patient reports no complaints.  Contractions: Irritability. Vag. Bleeding: None.  Movement: Present. Denies leaking of fluid.   The following portions of the patient's history were reviewed and updated as appropriate: allergies, current medications, past family history, past medical history, past social history, past surgical history and problem list.   Objective:   Vitals:   09/30/20 1055  BP: 117/64  Pulse: 92  Weight: 196 lb (88.9 kg)    Fetal Status: Fetal Heart Rate (bpm): 144 Fundal Height: 39 cm Movement: Present     General:  Alert, oriented and cooperative. Patient is in no acute distress.  Skin: Skin is warm and dry. No rash noted.   Cardiovascular: Normal heart rate noted  Respiratory: Normal respiratory effort, no problems with respiration noted  Abdomen: Soft, gravid, appropriate for gestational age.  Pain/Pressure: Present     Pelvic: Cervical exam deferred        Extremities: Normal range of motion.  Edema: None  Mental Status: Normal mood and affect. Normal behavior. Normal judgment and thought content.   Assessment and Plan:  Pregnancy: G1P0 at [redacted]w[redacted]d  1. Encounter for supervision of normal first pregnancy in third trimester - Routine OB care. No concerns today - Active fetal movement, BP normotensive  2. [redacted] weeks gestation of pregnancy - IOL scheduled on 8/18  3. HGSIL (high grade squamous intraepithelial lesion) on Pap smear of cervix - Needs colpo postpartum  4. AMA (advanced maternal age) primigravida  35+, unspecified trimester - LR NIPS   Term labor symptoms and general obstetric precautions including but not limited to vaginal bleeding, contractions, leaking of fluid and fetal movement were reviewed in detail with the patient. Please refer to After Visit Summary for other counseling recommendations.   No follow-ups on file.  Future Appointments  Date Time Provider Department Center  10/02/2020  9:10 AM [facility] ROOM [facility] None     Brand Males, CNM 09/30/20 11:18 AM

## 2020-10-01 ENCOUNTER — Inpatient Hospital Stay (HOSPITAL_COMMUNITY): Payer: 59

## 2020-10-01 ENCOUNTER — Other Ambulatory Visit: Payer: Self-pay | Admitting: Advanced Practice Midwife

## 2020-10-02 ENCOUNTER — Other Ambulatory Visit: Payer: Self-pay

## 2020-10-02 ENCOUNTER — Inpatient Hospital Stay (HOSPITAL_COMMUNITY)
Admission: AD | Admit: 2020-10-02 | Discharge: 2020-10-06 | DRG: 787 | Disposition: A | Discharge status: Home / Self Care | Payer: 59 | Attending: Family Medicine | Admitting: Family Medicine

## 2020-10-02 ENCOUNTER — Inpatient Hospital Stay (HOSPITAL_COMMUNITY): Payer: 59

## 2020-10-02 ENCOUNTER — Inpatient Hospital Stay (HOSPITAL_COMMUNITY): Payer: 59 | Admitting: Anesthesiology

## 2020-10-02 DIAGNOSIS — D62 Acute posthemorrhagic anemia: Secondary | ICD-10-CM | POA: Diagnosis not present

## 2020-10-02 DIAGNOSIS — Z3A41 41 weeks gestation of pregnancy: Secondary | ICD-10-CM | POA: Diagnosis not present

## 2020-10-02 DIAGNOSIS — O480 Post-term pregnancy: Secondary | ICD-10-CM | POA: Diagnosis not present

## 2020-10-02 DIAGNOSIS — Z3A00 Weeks of gestation of pregnancy not specified: Secondary | ICD-10-CM | POA: Diagnosis not present

## 2020-10-02 DIAGNOSIS — O9081 Anemia of the puerperium: Secondary | ICD-10-CM | POA: Diagnosis not present

## 2020-10-02 DIAGNOSIS — O09519 Supervision of elderly primigravida, unspecified trimester: Secondary | ICD-10-CM

## 2020-10-02 DIAGNOSIS — O324XX0 Maternal care for high head at term, not applicable or unspecified: Secondary | ICD-10-CM | POA: Diagnosis present

## 2020-10-02 DIAGNOSIS — Z141 Cystic fibrosis carrier: Secondary | ICD-10-CM | POA: Diagnosis not present

## 2020-10-02 LAB — TYPE AND SCREEN
ABO/RH(D): O POS
Antibody Screen: NEGATIVE

## 2020-10-02 LAB — CBC
HCT: 34.6 % — ABNORMAL LOW (ref 36.0–46.0)
Hemoglobin: 11.2 g/dL — ABNORMAL LOW (ref 12.0–15.0)
MCH: 25.9 pg — ABNORMAL LOW (ref 26.0–34.0)
MCHC: 32.4 g/dL (ref 30.0–36.0)
MCV: 79.9 fL — ABNORMAL LOW (ref 80.0–100.0)
Platelets: 348 10*3/uL (ref 150–400)
RBC: 4.33 MIL/uL (ref 3.87–5.11)
RDW: 15.6 % — ABNORMAL HIGH (ref 11.5–15.5)
WBC: 10.4 10*3/uL (ref 4.0–10.5)
nRBC: 0 % (ref 0.0–0.2)

## 2020-10-02 MED ORDER — FENTANYL-BUPIVACAINE-NACL 0.5-0.125-0.9 MG/250ML-% EP SOLN
EPIDURAL | Status: AC
  Filled 2020-10-02: qty 250

## 2020-10-02 MED ORDER — EPHEDRINE 5 MG/ML INJ: 10.0000 mg | INTRAVENOUS | Status: DC | PRN

## 2020-10-02 MED ORDER — LIDOCAINE HCL (PF) 1 % IJ SOLN
30.0000 mL | INTRAMUSCULAR | Status: DC | PRN
Start: 2020-10-02 — End: 2020-10-03

## 2020-10-02 MED ORDER — ONDANSETRON HCL 4 MG/2ML IJ SOLN: 4.0000 mg | Freq: Four times a day (QID) | INTRAMUSCULAR | Status: DC | PRN

## 2020-10-02 MED ORDER — OXYCODONE-ACETAMINOPHEN 5-325 MG PO TABS
2.0000 | ORAL_TABLET | ORAL | Status: DC | PRN
Start: 2020-10-02 — End: 2020-10-03

## 2020-10-02 MED ORDER — LACTATED RINGERS IV SOLN: 500.0000 mL | Freq: Once | INTRAVENOUS | Status: DC

## 2020-10-02 MED ORDER — DIPHENHYDRAMINE HCL 50 MG/ML IJ SOLN: 12.5000 mg | INTRAMUSCULAR | Status: DC | PRN

## 2020-10-02 MED ORDER — PHENYLEPHRINE 40 MCG/ML (10ML) SYRINGE FOR IV PUSH (FOR BLOOD PRESSURE SUPPORT): 80.0000 ug | PREFILLED_SYRINGE | INTRAVENOUS | Status: DC | PRN

## 2020-10-02 MED ORDER — OXYTOCIN-SODIUM CHLORIDE 30-0.9 UT/500ML-% IV SOLN
2.5000 [IU]/h | INTRAVENOUS | Status: DC
  Filled 2020-10-02: qty 500

## 2020-10-02 MED ORDER — OXYTOCIN BOLUS FROM INFUSION: 333.0000 mL | Freq: Once | INTRAVENOUS | Status: DC

## 2020-10-02 MED ORDER — MISOPROSTOL 50MCG HALF TABLET
ORAL_TABLET | ORAL | Status: AC
  Administered 2020-10-02: 50 ug
  Filled 2020-10-02: qty 1

## 2020-10-02 MED ORDER — FENTANYL-BUPIVACAINE-NACL 0.5-0.125-0.9 MG/250ML-% EP SOLN
12.0000 mL/h | EPIDURAL | Status: DC | PRN
  Administered 2020-10-02: 12 mL/h via EPIDURAL
  Filled 2020-10-02: qty 250

## 2020-10-02 MED ORDER — ACETAMINOPHEN 325 MG PO TABS: 650.0000 mg | ORAL_TABLET | ORAL | Status: DC | PRN

## 2020-10-02 MED ORDER — LACTATED RINGERS IV SOLN: INTRAVENOUS | Status: DC

## 2020-10-02 MED ORDER — MISOPROSTOL 50MCG HALF TABLET
50.0000 ug | ORAL_TABLET | ORAL | Status: DC
  Administered 2020-10-02: 50 ug via ORAL
  Filled 2020-10-02: qty 1

## 2020-10-02 MED ORDER — SOD CITRATE-CITRIC ACID 500-334 MG/5ML PO SOLN
30.0000 mL | ORAL | Status: DC | PRN
Start: 2020-10-02 — End: 2020-10-03
  Filled 2020-10-02: qty 30

## 2020-10-02 MED ORDER — LACTATED RINGERS IV SOLN: 500.0000 mL | INTRAVENOUS | Status: DC | PRN

## 2020-10-02 MED ORDER — FENTANYL-BUPIVACAINE-NACL 0.5-0.125-0.9 MG/250ML-% EP SOLN: 12.0000 mL/h | EPIDURAL | Status: DC | PRN

## 2020-10-02 MED ORDER — OXYCODONE-ACETAMINOPHEN 5-325 MG PO TABS: 1.0000 | ORAL_TABLET | ORAL | Status: DC | PRN

## 2020-10-02 MED ORDER — TERBUTALINE SULFATE 1 MG/ML IJ SOLN
0.2500 mg | Freq: Once | INTRAMUSCULAR | Status: DC | PRN
Start: 2020-10-02 — End: 2020-10-03

## 2020-10-02 MED ORDER — LACTATED RINGERS IV SOLN
500.0000 mL | Freq: Once | INTRAVENOUS | Status: AC
  Administered 2020-10-02: 500 mL via INTRAVENOUS

## 2020-10-02 MED ORDER — LIDOCAINE-EPINEPHRINE (PF) 2 %-1:200000 IJ SOLN
INTRAMUSCULAR | Status: DC | PRN
  Administered 2020-10-02: 5 mL via EPIDURAL

## 2020-10-02 MED ORDER — MISOPROSTOL 25 MCG QUARTER TABLET
25.0000 ug | ORAL_TABLET | ORAL | Status: DC | PRN
Start: 2020-10-02 — End: 2020-10-02

## 2020-10-02 MED ORDER — FENTANYL CITRATE (PF) 100 MCG/2ML IJ SOLN
100.0000 ug | INTRAMUSCULAR | Status: DC | PRN
  Administered 2020-10-02: 100 ug via INTRAVENOUS
  Filled 2020-10-02: qty 2

## 2020-10-02 NOTE — H&P (Signed)
OBSTETRIC ADMISSION HISTORY AND PHYSICAL  Elizabeth Perkins is a 35 y.o. female G1P0 with IUP at [redacted]w[redacted]d by LMP presenting for post-dates induction of labor. She reports +FMs, No LOF, no VB, no blurry vision, headaches or peripheral edema, and RUQ pain.  She plans on breast feeding. She is planning on condoms for birth control. She received her prenatal care at [facility]   Dating: By LMP --->  Estimated Date of Delivery: 09/24/20  Sono:    @[redacted]w[redacted]d , CWD, normal anatomy, cephalic presentation,  260g, EFW   Prenatal History/Complications: Advanced maternal age (35yo)  Past Medical History: Past Medical History:  Diagnosis Date   Abnormal Pap smear of cervix    HGSIL 2019 Never followed up   HPV in female    Vaginal Pap smear, abnormal    ASCUS   Vitamin D deficiency     Past Surgical History: Past Surgical History:  Procedure Laterality Date   COLPOSCOPY      Obstetrical History: OB History     Gravida  1   Para      Term      Preterm      AB      Living         SAB      IAB      Ectopic      Multiple      Live Births              Social History Social History   Socioeconomic History   Marital status: Single    Spouse name: Not on file   Number of children: Not on file   Years of education: Not on file   Highest education level: Not on file  Occupational History   Not on file  Tobacco Use   Smoking status: Never   Smokeless tobacco: Never  Vaping Use   Vaping Use: Never used  Substance and Sexual Activity   Alcohol use: Not Currently   Drug use: Never   Sexual activity: Yes    Birth control/protection: None  Other Topics Concern   Not on file  Social History Narrative   Not on file   Social Determinants of Health   Financial Resource Strain: Not on file  Food Insecurity: Not on file  Transportation Needs: Not on file  Physical Activity: Not on file  Stress: Not on file  Social Connections: Not on file    Family History: Family  History  Problem Relation Age of Onset   Healthy Mother    Healthy Father     Allergies: No Known Allergies  Medications Prior to Admission  Medication Sig Dispense Refill Last Dose   Prenatal Vit-Fe Fumarate-FA (PRENATAL VITAMIN) 27-0.8 MG TABS 1 tab daily 30 tablet 11 10/02/2020   SUMAtriptan (IMITREX) 25 MG tablet TAKE 1 TABLET BY MOUTH EVERY 2 HOURS AS NEEDED FOR MIGRAINE. NO MORE THAN 4 TABLETS IN 24 HOURS (Patient not taking: No sig reported)        Review of Systems   All systems reviewed and negative except as stated in HPI  Blood pressure 125/62, pulse 70, temperature 98.5 F (36.9 C), temperature source Oral, resp. rate 17, height 5\' 1"  (1.549 m), weight 84 kg, last menstrual period 12/19/2019. General appearance: alert, cooperative, and no distress Lungs: normal work of breathing on room air Heart: regular rate and rhythm Abdomen: soft, non-tender; bowel sounds normal Extremities: Homans sign is negative, no sign of DVT Presentation: cephalic Fetal monitoringBaseline: 130  bpm, Variability: Good {> 6 bpm), and Accelerations: Reactive Uterine activity irregularly spaced intermittent contractions Dilation: 1 Effacement (%): Thick Station: -3 Exam by:: Carlyn Reichert   Prenatal labs: ABO, Rh: --/--/PENDING (08/18 1309) Antibody: PENDING (08/18 1309) Rubella: 3.03 (01/13 1156) RPR: NON-REACTIVE (05/19 0000)  HBsAg: NON-REACTIVE (01/13 1156)  HIV: NON-REACTIVE (05/19 0000)  GBS:    1 hr Glucola WNL Genetic screening  Low Risk NIPS Anatomy US Normal  Prenatal Transfer Tool  Maternal Diabetes: No Genetic Screening: Normal Maternal Ultrasounds/Referrals: Normal Fetal Ultrasounds or other Referrals:  None Maternal Substance Abuse:  No Significant Maternal Medications:  None Significant Maternal Lab Results: Group B Strep negative  Results for orders placed or performed during the hospital encounter of 10/02/20 (from the past 24 hour(s))  Type and screen    Collection Time: 10/02/20  1:09 PM  Result Value Ref Range   ABO/RH(D) PENDING    Antibody Screen PENDING    Sample Expiration      10/05/2020,2359 Performed at [facility], [address] [place], [place] [address]     Patient Active Problem List   Diagnosis Date Noted   Post term pregnancy at [redacted] weeks gestation 10/02/2020   AMA (advanced maternal age) primigravida 35+, unspecified trimester 04/24/2020   Cystic fibrosis gene carrier 04/14/2020   Encounter for supervision of normal pregnancy 02/28/2020   HGSIL (high grade squamous intraepithelial lesion) on Pap smear of cervix 12/15/2016    Assessment/Plan:  Elizabeth Perkins is a 35 y.o. G1P0 at [redacted]w[redacted]d here for post-dates induction of labor.   #Labor: Irregularly spaced spontaneous contractions.  -Will start with buccal cytotec  #Pain: PRN, planning for future epidural #FWB: Cat 1 #ID:  GBS neg #MOF: Breast #MOC:Condoms #Circ:  N/a girl   Elizabeth Gibbs, MD  10/02/2020, 1:47 PM

## 2020-10-02 NOTE — Progress Notes (Signed)
Elizabeth Perkins is a 35 y.o. G1P0 at [redacted]w[redacted]d admitted for induction of labor due to post dates.  Subjective: Recent SROM with significant increase in painful contractions. Now more comfortable with epidural in place. No other concerns at this time.  Objective: BP 132/64   Pulse 70   Temp 98.6 F (37 C) (Oral)   Resp 15   Ht 5\' 1"  (1.549 m)   Wt 84 kg   LMP 12/19/2019   SpO2 100%   BMI 34.97 kg/m   FHT:  Baseline 120 bpm, moderate variability, + accels, early decels UC:   Every 2-3 minutes SVE:   Dilation: 3 Effacement (%): 80 Station: -2 Exam by:: J Mbugua RN  Labs: Lab Results  Component Value Date   WBC 10.4 10/02/2020   HGB 11.2 (L) 10/02/2020   HCT 34.6 (L) 10/02/2020   MCV 79.9 (L) 10/02/2020   PLT 348 10/02/2020    Assessment / Plan: 35 y.o. G1P0 at [redacted]w[redacted]d admitted for induction of labor due to post dates.   Labor: SVE now 3/80/-2 after SROM. S/P Cytotec x2. Contracting well on her own with painful contractions. Will expectantly manage and reassess in 3-4 hours. Fetal Wellbeing:  Category I Pain Control: Epidural now in place I/D:  GBS negative Anticipated MOD:  NSVD  [redacted]w[redacted]d, MD  [facility] Main Fellow  Faculty Practice

## 2020-10-02 NOTE — Progress Notes (Signed)
Elizabeth Perkins is a 35 y.o. G1P0 at [redacted]w[redacted]d admitted for induction of labor due to Post dates.   Subjective: Reports she feels some cramping. Reports she felt some leaking but no gush  Objective: BP 106/61   Pulse 68   Temp 98.1 F (36.7 C)   Resp 16   Ht 5\' 1"  (1.549 m)   Wt 84 kg   LMP 12/19/2019   BMI 34.97 kg/m  No intake/output data recorded. No intake/output data recorded.  FHT:  FHR: 130 bpm, variability: moderate,  accelerations:  Present,  decelerations:  Absent UC:   regular, every 1-2 minutes SVE:   Dilation: 1.5 Effacement (%): Thick Station: -3 Exam by:: Dr. [email or web address]  Labs: Lab Results  Component Value Date   WBC 10.4 10/02/2020   HGB 11.2 (L) 10/02/2020   HCT 34.6 (L) 10/02/2020   MCV 79.9 (L) 10/02/2020   PLT 348 10/02/2020    Assessment / Plan: Induction of labor due to post dates,  progressing well on pitocin  Labor:  Patient received cytotec x1, cervix posterior, discussed placing foley bulb with speculum and patient and partner would like to defer till next exam. Giving cytotec #2 Fetal Wellbeing:  Category I Pain Control:   plans epidural I/D:   GBS negative Anticipated MOD:  NSVD  10/04/2020, MD, MPH OB Fellow, Faculty Practice

## 2020-10-02 NOTE — Anesthesia Procedure Notes (Signed)
Epidural Patient location during procedure: OB Start time: 10/02/2020 9:45 PM End time: 10/02/2020 9:55 PM  Staffing Anesthesiologist: Elmer Picker, MD Performed: anesthesiologist   Preanesthetic Checklist Completed: patient identified, IV checked, risks and benefits discussed, monitors and equipment checked, pre-op evaluation and timeout performed  Epidural Patient position: sitting Prep: DuraPrep and site prepped and draped Patient monitoring: continuous pulse ox, blood pressure, heart rate and cardiac monitor Approach: midline Location: L3-L4 Injection technique: LOR air  Needle:  Needle type: Tuohy  Needle gauge: 17 G Needle length: 9 cm Needle insertion depth: 6 cm Catheter type: closed end flexible Catheter size: 19 Gauge Catheter at skin depth: 11 cm Test dose: negative  Assessment Sensory level: T8 Events: blood not aspirated, injection not painful, no injection resistance, no paresthesia and negative IV test  Additional Notes Patient identified. Risks/Benefits/Options discussed with patient including but not limited to bleeding, infection, nerve damage, paralysis, failed block, incomplete pain control, headache, blood pressure changes, nausea, vomiting, reactions to medication both or allergic, itching and postpartum back pain. Confirmed with bedside nurse the patient's most recent platelet count. Confirmed with patient that they are not currently taking any anticoagulation, have any bleeding history or any family history of bleeding disorders. Patient expressed understanding and wished to proceed. All questions were answered. Sterile technique was used throughout the entire procedure. Please see nursing notes for vital signs. Test dose was given through epidural catheter and negative prior to continuing to dose epidural or start infusion. Warning signs of high block given to the patient including shortness of breath, tingling/numbness in hands, complete motor block,  or any concerning symptoms with instructions to call for help. Patient was given instructions on fall risk and not to get out of bed. All questions and concerns addressed with instructions to call with any issues or inadequate analgesia.  Reason for block:procedure for pain

## 2020-10-02 NOTE — Anesthesia Preprocedure Evaluation (Signed)
Anesthesia Evaluation  °Patient identified by MRN, date of birth, ID band °Patient awake ° ° ° °Reviewed: °Allergy & Precautions, NPO status , Patient's Chart, lab work & pertinent test results ° °Airway °Mallampati: II ° °TM Distance: >3 FB °Neck ROM: Full ° ° ° Dental °no notable dental hx. ° °  °Pulmonary °neg pulmonary ROS,  °  °Pulmonary exam normal °breath sounds clear to auscultation ° ° ° ° ° ° Cardiovascular °negative cardio ROS °Normal cardiovascular exam °Rhythm:Regular Rate:Normal ° ° °  °Neuro/Psych °negative neurological ROS ° negative psych ROS  ° GI/Hepatic °negative GI ROS, Neg liver ROS,   °Endo/Other  °negative endocrine ROS ° Renal/GU °negative Renal ROS  °negative genitourinary °  °Musculoskeletal °negative musculoskeletal ROS °(+)  ° Abdominal °  °Peds ° Hematology °negative hematology ROS °(+)   °Anesthesia Other Findings °IOL for post dates ° Reproductive/Obstetrics °(+) Pregnancy ° °  ° ° ° ° ° ° ° ° ° ° ° ° ° °  °  ° ° ° ° ° ° ° ° °Anesthesia Physical °Anesthesia Plan ° °ASA: 2 ° °Anesthesia Plan: Epidural  ° °Post-op Pain Management:   ° °Induction:  ° °PONV Risk Score and Plan: Treatment may vary due to age or medical condition ° °Airway Management Planned: Natural Airway ° °Additional Equipment:  ° °Intra-op Plan:  ° °Post-operative Plan:  ° °Informed Consent: I have reviewed the patients History and Physical, chart, labs and discussed the procedure including the risks, benefits and alternatives for the proposed anesthesia with the patient or authorized representative who has indicated his/her understanding and acceptance.  ° ° ° ° ° °Plan Discussed with: Anesthesiologist ° °Anesthesia Plan Comments: (Patient identified. Risks, benefits, options discussed with patient including but not limited to bleeding, infection, nerve damage, paralysis, failed block, incomplete pain control, headache, blood pressure changes, nausea, vomiting, reactions to  medication, itching, and post partum back pain. Confirmed with bedside nurse the patient's most recent platelet count. Confirmed with the patient that they are not taking any anticoagulation, have any bleeding history or any family history of bleeding disorders. Patient expressed understanding and wishes to proceed. All questions were answered. )  ° ° ° ° ° ° °Anesthesia Quick Evaluation ° °

## 2020-10-03 ENCOUNTER — Encounter (HOSPITAL_COMMUNITY)
Admission: AD | Disposition: A | Discharge status: Home / Self Care | Payer: Self-pay | Source: Home / Self Care | Attending: Family Medicine

## 2020-10-03 DIAGNOSIS — Z3A41 41 weeks gestation of pregnancy: Secondary | ICD-10-CM

## 2020-10-03 DIAGNOSIS — O480 Post-term pregnancy: Secondary | ICD-10-CM

## 2020-10-03 LAB — RPR: RPR Ser Ql: NONREACTIVE

## 2020-10-03 SURGERY — Surgical Case
Anesthesia: Epidural | Site: Abdomen | Wound class: Clean Contaminated

## 2020-10-03 MED ORDER — MEPERIDINE HCL 25 MG/ML IJ SOLN: 6.2500 mg | INTRAMUSCULAR | Status: DC | PRN

## 2020-10-03 MED ORDER — DIBUCAINE (PERIANAL) 1 % EX OINT: 1.0000 "application " | TOPICAL_OINTMENT | CUTANEOUS | Status: DC | PRN

## 2020-10-03 MED ORDER — SIMETHICONE 80 MG PO CHEW: 80.0000 mg | CHEWABLE_TABLET | ORAL | Status: DC | PRN

## 2020-10-03 MED ORDER — FENTANYL CITRATE (PF) 100 MCG/2ML IJ SOLN
INTRAMUSCULAR | Status: AC
  Filled 2020-10-03: qty 2

## 2020-10-03 MED ORDER — SODIUM CHLORIDE 0.9 % IV SOLN
500.0000 mg | INTRAVENOUS | Status: AC
  Administered 2020-10-03: 500 mg via INTRAVENOUS

## 2020-10-03 MED ORDER — KETOROLAC TROMETHAMINE 30 MG/ML IJ SOLN
30.0000 mg | Freq: Four times a day (QID) | INTRAMUSCULAR | Status: AC | PRN
  Administered 2020-10-03 – 2020-10-04 (×4): 30 mg via INTRAVENOUS

## 2020-10-03 MED ORDER — DIPHENHYDRAMINE HCL 25 MG PO CAPS: 25.0000 mg | ORAL_CAPSULE | ORAL | Status: DC | PRN

## 2020-10-03 MED ORDER — OXYTOCIN-SODIUM CHLORIDE 30-0.9 UT/500ML-% IV SOLN
INTRAVENOUS | Status: AC
  Filled 2020-10-03: qty 500

## 2020-10-03 MED ORDER — ZOLPIDEM TARTRATE 5 MG PO TABS: 5.0000 mg | ORAL_TABLET | Freq: Every evening | ORAL | Status: DC | PRN

## 2020-10-03 MED ORDER — DIPHENHYDRAMINE HCL 25 MG PO CAPS: 25.0000 mg | ORAL_CAPSULE | Freq: Four times a day (QID) | ORAL | Status: DC | PRN

## 2020-10-03 MED ORDER — MENTHOL 3 MG MT LOZG: 1.0000 | LOZENGE | OROMUCOSAL | Status: DC | PRN

## 2020-10-03 MED ORDER — NALBUPHINE HCL 10 MG/ML IJ SOLN
5.0000 mg | Freq: Once | INTRAMUSCULAR | Status: DC | PRN
Start: 2020-10-03 — End: 2020-10-06

## 2020-10-03 MED ORDER — FENTANYL CITRATE (PF) 100 MCG/2ML IJ SOLN
INTRAMUSCULAR | Status: DC | PRN
  Administered 2020-10-03 (×2): 50 ug via INTRAVENOUS

## 2020-10-03 MED ORDER — SODIUM CHLORIDE 0.9 % IR SOLN
Status: DC | PRN
  Administered 2020-10-03: 1000 mL

## 2020-10-03 MED ORDER — OXYTOCIN-SODIUM CHLORIDE 30-0.9 UT/500ML-% IV SOLN
INTRAVENOUS | Status: DC | PRN
  Administered 2020-10-03: 250 mL via INTRAVENOUS

## 2020-10-03 MED ORDER — ONDANSETRON HCL 4 MG/2ML IJ SOLN
INTRAMUSCULAR | Status: DC | PRN
  Administered 2020-10-03: 4 mg via INTRAVENOUS

## 2020-10-03 MED ORDER — SODIUM CHLORIDE 0.9% FLUSH: 3.0000 mL | INTRAVENOUS | Status: DC | PRN

## 2020-10-03 MED ORDER — WITCH HAZEL-GLYCERIN EX PADS: 1.0000 "application " | MEDICATED_PAD | CUTANEOUS | Status: DC | PRN

## 2020-10-03 MED ORDER — DIPHENHYDRAMINE HCL 50 MG/ML IJ SOLN: 12.5000 mg | INTRAMUSCULAR | Status: DC | PRN

## 2020-10-03 MED ORDER — ONDANSETRON HCL 4 MG/2ML IJ SOLN
INTRAMUSCULAR | Status: AC
  Filled 2020-10-03: qty 2

## 2020-10-03 MED ORDER — TERBUTALINE SULFATE 1 MG/ML IJ SOLN: 0.2500 mg | Freq: Once | INTRAMUSCULAR | Status: DC | PRN

## 2020-10-03 MED ORDER — STERILE WATER FOR IRRIGATION IR SOLN
Status: DC | PRN
  Administered 2020-10-03: 1000 mL

## 2020-10-03 MED ORDER — SENNOSIDES-DOCUSATE SODIUM 8.6-50 MG PO TABS
2.0000 | ORAL_TABLET | Freq: Every day | ORAL | Status: DC
  Administered 2020-10-04 – 2020-10-06 (×3): 2 via ORAL
  Filled 2020-10-03 (×3): qty 2

## 2020-10-03 MED ORDER — TETANUS-DIPHTH-ACELL PERTUSSIS 5-2.5-18.5 LF-MCG/0.5 IM SUSY: 0.5000 mL | PREFILLED_SYRINGE | Freq: Once | INTRAMUSCULAR | Status: DC

## 2020-10-03 MED ORDER — COCONUT OIL OIL: 1.0000 "application " | TOPICAL_OIL | Status: DC | PRN

## 2020-10-03 MED ORDER — ONDANSETRON HCL 4 MG/2ML IJ SOLN
4.0000 mg | Freq: Three times a day (TID) | INTRAMUSCULAR | Status: DC | PRN
  Administered 2020-10-03: 4 mg via INTRAVENOUS
  Filled 2020-10-03: qty 2

## 2020-10-03 MED ORDER — SIMETHICONE 80 MG PO CHEW
80.0000 mg | CHEWABLE_TABLET | Freq: Three times a day (TID) | ORAL | Status: DC
  Administered 2020-10-04 – 2020-10-06 (×6): 80 mg via ORAL
  Filled 2020-10-03 (×6): qty 1

## 2020-10-03 MED ORDER — NALBUPHINE HCL 10 MG/ML IJ SOLN: 5.0000 mg | INTRAMUSCULAR | Status: DC | PRN

## 2020-10-03 MED ORDER — SCOPOLAMINE 1 MG/3DAYS TD PT72: 1.0000 | MEDICATED_PATCH | Freq: Once | TRANSDERMAL | Status: DC

## 2020-10-03 MED ORDER — ENOXAPARIN SODIUM 40 MG/0.4ML IJ SOSY
40.0000 mg | PREFILLED_SYRINGE | INTRAMUSCULAR | Status: DC
  Administered 2020-10-04 – 2020-10-06 (×3): 40 mg via SUBCUTANEOUS
  Filled 2020-10-03 (×3): qty 0.4

## 2020-10-03 MED ORDER — PRENATAL MULTIVITAMIN CH
1.0000 | ORAL_TABLET | Freq: Every day | ORAL | Status: DC
  Administered 2020-10-04 – 2020-10-06 (×3): 1 via ORAL
  Filled 2020-10-03 (×3): qty 1

## 2020-10-03 MED ORDER — MISOPROSTOL 50MCG HALF TABLET
50.0000 ug | ORAL_TABLET | ORAL | Status: DC | PRN
Start: 2020-10-03 — End: 2020-10-03

## 2020-10-03 MED ORDER — OXYTOCIN-SODIUM CHLORIDE 30-0.9 UT/500ML-% IV SOLN
1.0000 m[IU]/min | INTRAVENOUS | Status: DC
  Administered 2020-10-03: 2 m[IU]/min via INTRAVENOUS

## 2020-10-03 MED ORDER — SODIUM CHLORIDE 0.9 % IV SOLN
INTRAVENOUS | Status: AC
  Filled 2020-10-03: qty 500

## 2020-10-03 MED ORDER — KETOROLAC TROMETHAMINE 30 MG/ML IJ SOLN
INTRAMUSCULAR | Status: AC
  Filled 2020-10-03: qty 1

## 2020-10-03 MED ORDER — MORPHINE SULFATE (PF) 0.5 MG/ML IJ SOLN
INTRAMUSCULAR | Status: DC | PRN
  Administered 2020-10-03: 3 mg via EPIDURAL

## 2020-10-03 MED ORDER — SOD CITRATE-CITRIC ACID 500-334 MG/5ML PO SOLN: 30.0000 mL | ORAL | Status: DC

## 2020-10-03 MED ORDER — OXYTOCIN-SODIUM CHLORIDE 30-0.9 UT/500ML-% IV SOLN: 2.5000 [IU]/h | INTRAVENOUS | Status: AC

## 2020-10-03 MED ORDER — KETOROLAC TROMETHAMINE 30 MG/ML IJ SOLN
30.0000 mg | Freq: Four times a day (QID) | INTRAMUSCULAR | Status: AC | PRN
  Filled 2020-10-03 (×3): qty 1

## 2020-10-03 MED ORDER — NALOXONE HCL 4 MG/10ML IJ SOLN
1.0000 ug/kg/h | INTRAVENOUS | Status: DC | PRN
  Filled 2020-10-03: qty 5

## 2020-10-03 MED ORDER — CEFAZOLIN SODIUM-DEXTROSE 2-4 GM/100ML-% IV SOLN
2.0000 g | INTRAVENOUS | Status: AC
  Administered 2020-10-03: 2 g via INTRAVENOUS

## 2020-10-03 MED ORDER — ACETAMINOPHEN 325 MG PO TABS: 650.0000 mg | ORAL_TABLET | ORAL | Status: DC | PRN

## 2020-10-03 MED ORDER — MORPHINE SULFATE (PF) 0.5 MG/ML IJ SOLN
INTRAMUSCULAR | Status: AC
  Filled 2020-10-03: qty 10

## 2020-10-03 MED ORDER — OXYCODONE HCL 5 MG PO TABS
5.0000 mg | ORAL_TABLET | ORAL | Status: DC | PRN
  Administered 2020-10-04: 5 mg via ORAL
  Administered 2020-10-05: 10 mg via ORAL
  Filled 2020-10-03: qty 2
  Filled 2020-10-03: qty 1

## 2020-10-03 MED ORDER — NALOXONE HCL 0.4 MG/ML IJ SOLN: 0.4000 mg | INTRAMUSCULAR | Status: DC | PRN

## 2020-10-03 MED ORDER — CEFAZOLIN SODIUM-DEXTROSE 2-4 GM/100ML-% IV SOLN
INTRAVENOUS | Status: AC
  Filled 2020-10-03: qty 100

## 2020-10-03 SURGICAL SUPPLY — 31 items
BENZOIN TINCTURE PRP APPL 2/3 (GAUZE/BANDAGES/DRESSINGS) ×2 IMPLANT
CLOSURE STERI STRIP 1/2 X4 (GAUZE/BANDAGES/DRESSINGS) ×2 IMPLANT
CLOTH BEACON ORANGE TIMEOUT ST (SAFETY) ×2 IMPLANT
DRSG OPSITE POSTOP 4X10 (GAUZE/BANDAGES/DRESSINGS) ×2 IMPLANT
ELECT REM PT RETURN 9FT ADLT (ELECTROSURGICAL) ×2
ELECTRODE REM PT RTRN 9FT ADLT (ELECTROSURGICAL) ×1 IMPLANT
EXTRACTOR VACUUM KIWI (MISCELLANEOUS) IMPLANT
GLOVE BIOGEL PI IND STRL 7.0 (GLOVE) ×1 IMPLANT
GLOVE BIOGEL PI INDICATOR 7.0 (GLOVE) ×1
GLOVE SURG ORTHO 8.0 STRL STRW (GLOVE) ×2 IMPLANT
GOWN STRL REUS W/TWL LRG LVL3 (GOWN DISPOSABLE) ×4 IMPLANT
KIT ABG SYR 3ML LUER SLIP (SYRINGE) IMPLANT
NEEDLE HYPO 25X5/8 SAFETYGLIDE (NEEDLE) IMPLANT
NS IRRIG 1000ML POUR BTL (IV SOLUTION) ×2 IMPLANT
PACK C SECTION WH (CUSTOM PROCEDURE TRAY) ×2 IMPLANT
PAD OB MATERNITY 4.3X12.25 (PERSONAL CARE ITEMS) ×2 IMPLANT
PENCIL SMOKE EVAC W/HOLSTER (ELECTROSURGICAL) ×2 IMPLANT
RTRCTR C-SECT PINK 25CM LRG (MISCELLANEOUS) IMPLANT
STRIP CLOSURE SKIN 1/2X4 (GAUZE/BANDAGES/DRESSINGS) ×2 IMPLANT
SUT MON AB-0 CT1 36 (SUTURE) ×4 IMPLANT
SUT PLAIN 0 NONE (SUTURE) IMPLANT
SUT VIC AB 0 CT1 27 (SUTURE) ×2
SUT VIC AB 0 CT1 27XBRD ANBCTR (SUTURE) ×2 IMPLANT
SUT VIC AB 2-0 CT1 27 (SUTURE) ×1
SUT VIC AB 2-0 CT1 TAPERPNT 27 (SUTURE) ×1 IMPLANT
SUT VIC AB 3-0 SH 27 (SUTURE) ×1
SUT VIC AB 3-0 SH 27X BRD (SUTURE) ×1 IMPLANT
SUT VIC AB 4-0 KS 27 (SUTURE) ×2 IMPLANT
TOWEL OR 17X24 6PK STRL BLUE (TOWEL DISPOSABLE) ×2 IMPLANT
TRAY FOLEY W/BAG SLVR 14FR LF (SET/KITS/TRAYS/PACK) ×2 IMPLANT
WATER STERILE IRR 1000ML POUR (IV SOLUTION) ×2 IMPLANT

## 2020-10-03 NOTE — Discharge Summary (Signed)
Note done in ERROR   Please see Dr. Frederica Kuster discharge summary  Elizabeth Aloe, MD

## 2020-10-03 NOTE — Anesthesia Postprocedure Evaluation (Signed)
Anesthesia Post Note  Patient: Elizabeth Perkins  Procedure(s) Performed: CESAREAN SECTION (Abdomen)     Patient location during evaluation: PACU Anesthesia Type: Epidural Level of consciousness: awake and alert Pain management: pain level controlled Vital Signs Assessment: post-procedure vital signs reviewed and stable Respiratory status: spontaneous breathing, nonlabored ventilation and respiratory function stable Cardiovascular status: stable Postop Assessment: no headache, no backache and epidural receding Anesthetic complications: no   No notable events documented.  Last Vitals:  Vitals:   10/03/20 2100 10/03/20 2148  BP:    Pulse:  74  Resp: 18 16  Temp:    SpO2:  97%    Last Pain:  Vitals:   10/03/20 2148  TempSrc:   PainSc: 0-No pain   Pain Goal: Patients Stated Pain Goal: 0 (10/03/20 0725)                 [name]

## 2020-10-03 NOTE — Progress Notes (Signed)
Labor Progress Note Elizabeth Perkins is a 35 y.o. G1P0 at [redacted]w[redacted]d presented for postdates induction of labor S: Per pt request MD called to room.  Pt desires to stop pushing because she is tired.  Pt has been pushing 1 hour  and twenty minutes per nursing.  I offered therapeutic rest before another short trial before making a decision.  Pt refused and is adamant that she receive a cesarean section now.  Discussed that with primiparous patients with an epidural current guidelines suggest up to four hours of pushing can be tolerated.  The risks of surgery were discussed with the patient including but were not limited to: bleeding which may require transfusion or reoperation; infection which may require antibiotics; injury to bowel, bladder, ureters or other surrounding organs; injury to the fetus; need for additional procedures including hysterectomy in the event of a life-threatening hemorrhage; formation of adhesions; placental abnormalities wth subsequent pregnancies; incisional problems; thromboembolic phenomenon and other postoperative/anesthesia complications.  The patient concurred with the proposed plan, giving informed written consent for the procedure.   Anesthesia and OR aware. Preoperative prophylactic antibiotics and SCDs ordered on call to the OR.  To OR when ready.  Also discussed longer recovery time at home as well as potential complications in the future where more cesarean sections may be needed for delivery.  Pt notes understanding, but refuses therapeutic rest or another trial of pushing    O:  BP 127/66   Pulse 79   Temp 99.5 F (37.5 C)   Resp 17   Ht 5\' 1"  (1.549 m)   Wt 84 kg   LMP 12/19/2019   SpO2 100%   BMI 34.97 kg/m  EFM:  120's / accels noted, no decels, moderate variability  CVE: Dilation: 10 Dilation Complete Date: 10/03/20 Dilation Complete Time: 1126 Effacement (%): 100 Station: Plus 1 Presentation: Vertex Exam by:: Dr. [email or web address]   A&P: 35 y.o. G1P0 [redacted]w[redacted]d  postdates #Labor: failure to descend, poor maternal effort Risks and benefits of the procedure have been given including bleeding, infection and involvement of other organs.  Pt wishes to proceed.  [redacted]w[redacted]d, MD 2:55 PM

## 2020-10-03 NOTE — Progress Notes (Signed)
Elizabeth Perkins is a 35 y.o. G1P0 at [redacted]w[redacted]d admitted for induction of labor due to post dates.  Subjective: Resting comfortably, no concerns at this time.  Objective: BP 122/72   Pulse 66   Temp 99 F (37.2 C) (Oral)   Resp 16   Ht 5\' 1"  (1.549 m)   Wt 84 kg   LMP 12/19/2019   SpO2 100%   BMI 34.97 kg/m   FHT:  Baseline 135 bpm, moderate variability, + accels, early decels UC:   Every 2-3 minutes SVE:   Dilation: 5.5 Effacement (%): 90 Station: -1 Exam by:: Dr. [email or web address]  Labs: Lab Results  Component Value Date   WBC 10.4 10/02/2020   HGB 11.2 (L) 10/02/2020   HCT 34.6 (L) 10/02/2020   MCV 79.9 (L) 10/02/2020   PLT 348 10/02/2020    Assessment / Plan: 35 y.o. G1P0 at [redacted]w[redacted]d admitted for induction of labor due to post dates.   Labor: Patient continues to progress on her own. Fetal head well-engaged in the pelvis. Contracting every 2-3 minutes. Will continue to expectantly manage and reassess in 3-4 hours.  Fetal Wellbeing: Category I Pain Control: Epidural I/D:  GBS negative Anticipated MOD:  NSVD  [redacted]w[redacted]d, MD  OB Fellow  Faculty Practice

## 2020-10-03 NOTE — Progress Notes (Signed)
Alanis Clift is a 34yo G1P0 at [redacted]w[redacted]d admitted for post-dates induction of labor.  Labor Progress Note S: Feeling pressure lower in pelvis. Pain well-controlled with epidural.  O:  BP (!) 122/52   Pulse 69   Temp 99.8 F (37.7 C)   Resp 16   Ht 5\' 1"  (1.549 m)   Wt 84 kg   LMP 12/19/2019   SpO2 100%   BMI 34.97 kg/m  EFM: 135/moderate variability/accelerations present  CVE: Dilation: Lip/rim Effacement (%): 100 Station: Plus 1 Presentation: Vertex Exam by:: [name]   A&P: 35 y.o. G1P0 [redacted]w[redacted]d here for post-dates IOL. SROM at 2033 yesterday.  #Labor: Progressing well. Anterior lip on patient's R side. Now to +1 station. Contracting every 3-4 minutes.  #Pain: Epidural #FWB: Cat I #GBS negative  2034, MD 9:24 AM

## 2020-10-03 NOTE — Transfer of Care (Signed)
Immediate Anesthesia Transfer of Care Note  Patient: Elizabeth Perkins  Procedure(s) Performed: CESAREAN SECTION (Abdomen)  Patient Location: PACU  Anesthesia Type:Epidural  Level of Consciousness: awake, alert  and oriented  Airway & Oxygen Therapy: Patient Spontanous Breathing  Post-op Assessment: Report given to RN, Post -op Vital signs reviewed and stable and Patient moving all extremities X 4  Post vital signs: Reviewed and stable  Last Vitals:  Vitals Value Taken Time  BP 107/61 10/03/20 1634  Temp 36.9 C 10/03/20 1639  Pulse 84 10/03/20 1640  Resp 22 10/03/20 1640  SpO2 100 % 10/03/20 1640  Vitals shown include unvalidated device data.  Last Pain:  Vitals:   10/03/20 1639  TempSrc: Oral  PainSc:       Patients Stated Pain Goal: 0 (10/03/20 0725)  Complications: No notable events documented.

## 2020-10-03 NOTE — Progress Notes (Signed)
Elizabeth Perkins is a 35 y.o. G1P0 at [redacted]w[redacted]d admitted for induction of labor due to post dates.  Subjective: Feeling a little more pain/pressure in her lower belly. Resting comfortably. No concerns at this time.   Objective: BP 139/73   Pulse 70   Temp 99 F (37.2 C) (Oral)   Resp 17   Ht 5\' 1"  (1.549 m)   Wt 84 kg   LMP 12/19/2019   SpO2 100%   BMI 34.97 kg/m   FHT:  Baseline 130 bpm, moderate variability, + accels, early decels UC:  Every 2-4 minutes SVE:    Dilation: Lip/rim Effacement (%): 100 Station: -1 Exam by:: Dr. [email or web address]  Labs: Lab Results  Component Value Date   WBC 10.4 10/02/2020   HGB 11.2 (L) 10/02/2020   HCT 34.6 (L) 10/02/2020   MCV 79.9 (L) 10/02/2020   PLT 348 10/02/2020    Assessment / Plan: 35 y.o. G1P0 at [redacted]w[redacted]d admitted for induction of labor due to post dates.   Labor: Progressing well. Near complete at -1 station. Continues to have contractions every 2-4 minutes. Will position patient in high fowlers and reassess in one hour.  Fetal Wellbeing: Category I Pain Control: Epidural I/D:  GBS negative Anticipated MOD:  NSVD  [redacted]w[redacted]d, MD  OB Fellow  Faculty Practice

## 2020-10-03 NOTE — Op Note (Signed)
Elizabeth Perkins PROCEDURE DATE: 10/03/2020  PREOPERATIVE DIAGNOSES: Intrauterine pregnancy at [redacted]w[redacted]d weeks gestation; failure to progress: arrest of descent and patient declines vag del attempt  POSTOPERATIVE DIAGNOSES: The same with occiput posterior position  PROCEDURE: Primary Low Transverse Cesarean Section  SURGEON:  Dr. Mariel Aloe  ASSISTANT:  Milas Hock, MD  ANESTHESIOLOGY TEAM: Anesthesiologist: Bethena Midget, MD; Elmer Picker, MD CRNA: Aundria Rud, CRNA; Elgie Congo, CRNA  INDICATIONS: Elizabeth Perkins is a 34 y.o. G1P1001 at [redacted]w[redacted]d here for cesarean section secondary to the indications listed under preoperative diagnoses; please see preoperative note for further details.  The risks of surgery were discussed with the patient including but were not limited to: bleeding which may require transfusion or reoperation; infection which may require antibiotics; injury to bowel, bladder, ureters or other surrounding organs; injury to the fetus; need for additional procedures including hysterectomy in the event of a life-threatening hemorrhage; formation of adhesions; placental abnormalities wth subsequent pregnancies; incisional problems; thromboembolic phenomenon and other postoperative/anesthesia complications.  The patient concurred with the proposed plan, giving informed written consent for the procedure.    FINDINGS:  Viable female infant in cephalic presentation.  Apgars are pending.  Clear amniotic fluid.  Intact placenta, three vessel cord.  Normal uterus, fallopian tubes and ovaries bilaterally.  ANESTHESIA: Epidural  INTRAVENOUS FLUIDS: 750 ml   ESTIMATED BLOOD LOSS: 427 ml URINE OUTPUT:  25 ml SPECIMENS: Placenta sent to L&D COMPLICATIONS: None immediate  PROCEDURE IN DETAIL:  The patient preoperatively received intravenous antibiotics and had sequential compression devices applied to her lower extremities.  She was then taken to the operating room where the  epidural anesthesia was dosed up to surgical level and was found to be adequate. She was then placed in a dorsal supine position with a leftward tilt, and prepped and draped in a sterile manner.  A foley catheter was placed into her bladder and attached to constant gravity.  After an adequate timeout was performed, a Pfannenstiel skin incision was made with scalpel two fingerbreaths above the pubic symphysis and carried through to the underlying layer of fascia. The fascia was incised in the midline, and this incision was extended bilaterally using the Mayo scissors.  Kocher clamps x 2 were applied to the superior aspect of the fascial incision and the underlying rectus muscles were dissected off bluntly and sharply.  A similar process was carried out on the inferior aspect of the fascial incision. The rectus muscles were separated in the midline and the peritoneum was entered bluntly. The Alexis self-retaining retractor was introduced into the abdominal cavity.  The vesicouterine peritoneum was identified and grasped using smooth pickups.  It was incised and extended laterally using the Metzenbaum scissors.  A bladder flap was then digitally created.  Attention was turned to the lower uterine segment where a low transverse hysterotomy was made with a scalpel and extended bilaterally bluntly.  The infant was successfully delivered, the cord was clamped and cut after one minute, and the infant was handed over to the awaiting neonatology team. Uterine massage was then administered, and the placenta delivered intact with a three-vessel cord. The uterus was then cleared of clots and debris using manual curettage.  The uterine incision was closed with 0 monocryl in a running locked fashion, and an imbricating layer was also placed with 0 monocryl.    The pelvis was cleared of all clot and debris with irrigation and suction. Hemostasis was confirmed on all surfaces.  The retractor  was removed.  The peritoneum was closed  with a 2-0 Vicryl running stitch . The fascia was then closed using 0 Vicryl in a running fashion.  The subcutaneous layer was irrigated, reapproximated with 2-0 vicryl interrupted stitches, and the skin was closed with a 4-0 Vicryl subcuticular stitch. The patient tolerated the procedure well. Sponge, instrument and needle counts were correct x 3.  She was taken to the recovery room in stable condition.    Mariel Aloe, MD, FACOG Obstetrician & Gynecologist, [facility] for [facility], [facility]

## 2020-10-04 ENCOUNTER — Encounter (HOSPITAL_COMMUNITY): Payer: Self-pay | Admitting: Obstetrics and Gynecology

## 2020-10-04 LAB — CBC
HCT: 26.5 % — ABNORMAL LOW (ref 36.0–46.0)
Hemoglobin: 8.6 g/dL — ABNORMAL LOW (ref 12.0–15.0)
MCH: 25.7 pg — ABNORMAL LOW (ref 26.0–34.0)
MCHC: 32.5 g/dL (ref 30.0–36.0)
MCV: 79.3 fL — ABNORMAL LOW (ref 80.0–100.0)
Platelets: 239 10*3/uL (ref 150–400)
RBC: 3.34 MIL/uL — ABNORMAL LOW (ref 3.87–5.11)
RDW: 15.7 % — ABNORMAL HIGH (ref 11.5–15.5)
WBC: 17 10*3/uL — ABNORMAL HIGH (ref 4.0–10.5)
nRBC: 0 % (ref 0.0–0.2)

## 2020-10-04 LAB — CREATININE, SERUM
Creatinine, Ser: 0.85 mg/dL (ref 0.44–1.00)
GFR, Estimated: >60 mL/min (ref >60)

## 2020-10-04 MED ORDER — IBUPROFEN 600 MG PO TABS
600.0000 mg | ORAL_TABLET | Freq: Four times a day (QID) | ORAL | Status: DC | PRN
  Administered 2020-10-04 – 2020-10-06 (×6): 600 mg via ORAL
  Filled 2020-10-04 (×6): qty 1

## 2020-10-04 MED ORDER — LACTATED RINGERS IV BOLUS
500.0000 mL | Freq: Once | INTRAVENOUS | Status: AC
  Administered 2020-10-04: 500 mL via INTRAVENOUS

## 2020-10-04 NOTE — Progress Notes (Signed)
POSTPARTUM PROGRESS NOTE  Post Partum Day 1  Subjective:  Elizabeth Perkins is a 35 y.o. G1P1001 s/p LTCS at [redacted]w[redacted]d.  No acute events overnight.  Pt denies problems with ambulating, voiding or po intake.  She denies nausea or vomiting.  Pain is well controlled.  She has had flatus. She has not had bowel movement.  Lochia Minimal.   Objective: Blood pressure 105/67, pulse 80, temperature 97.9 F (36.6 C), temperature source Oral, resp. rate 16, height 5\' 1"  (1.549 m), weight 84 kg, last menstrual period 12/19/2019, SpO2 99 %, unknown if currently breastfeeding.  Patient Vitals for the past 24 hrs:  BP Temp Temp src Pulse Resp SpO2  10/04/20 0530 105/67 97.9 F (36.6 C) Oral 80 16 99 %  10/04/20 0145 113/66 97.7 F (36.5 C) Oral 64 16 96 %  10/03/20 2347 113/78 98 F (36.7 C) Oral 74 18 97 %  10/03/20 2148 -- -- -- 74 16 97 %  10/03/20 2100 -- -- -- -- 18 --  10/03/20 1950 112/72 98.1 F (36.7 C) Oral 69 18 97 %  10/03/20 1820 110/66 98.6 F (37 C) Oral 77 17 98 %  10/03/20 1745 121/73 -- -- 80 17 97 %  10/03/20 1730 104/63 -- -- 73 16 --  10/03/20 1715 113/72 -- -- 72 13 97 %  10/03/20 1700 116/68 -- -- 86 13 96 %  10/03/20 1645 100/66 -- -- 85 (!) 21 99 %  10/03/20 1639 107/61 98.4 F (36.9 C) Oral 85 15 99 %  10/03/20 1501 126/70 -- -- 72 -- --   Physical Exam:  General: alert, cooperative and no distress Chest: no respiratory distress Heart:regular rate Abdomen: soft, nontender Uterine Fundus: firm, appropriately tender DVT Evaluation: No calf swelling or tenderness Extremities: No edema Skin: warm, dry; incision clean/intact, small areas x3 of red blood, will have RN mark with sharpie to monitor for spreading Patient currently with urinary catheter still in place  Recent Labs    10/02/20 1309 10/04/20 0519  HGB 11.2* 8.6*  HCT 34.6* 26.5*   Assessment/Plan: Elizabeth Perkins is a 35 y.o. G1P1001 s/p LTCS at [redacted]w[redacted]d   PPD#1 - Doing well, but urinary catheter still  in place and possible new bleeding from incision site, will monitor, RN to mark site Contraception: condoms Feeding: breast Anemia: Venofer ordered Dispo: Plan for discharge day 2-3, pending patient status.   LOS: 2 days   Starla Deller, [redacted]w[redacted]d, NP, CNM 10/04/2020, 12:41 PM

## 2020-10-04 NOTE — Lactation Note (Signed)
This note was copied from a baby's chart. Lactation Consultation Note  Patient Name: Elizabeth Perkins Date: 10/04/2020 Reason for consult: Follow-up assessment;Mother's request;Difficult latch;Primapara;1st time breastfeeding;Term;Other (Comment) (Vit D Def) Age:35 years  Mom requested latch assistance. Infant not latching to finger due to holding tongue back and tongue thrusting.  Infant recent feeding of 5 ml.   Plan 1. To feed based on cues 8-12x 24 hr period. Mom to pre pump with manual to extend her nipple.  2. If unable to latch, Mom to do hand express and offer EBM via spoon then try latching.  3. Mom to supplement with EBM first followed by formula with paced bottle feeding and extra slow flow nipple. If infant not latching, volume want to reach 10-15 ml or more as tolerated.  4 Mom to use manual pump q 3hrs for 10 min each breasts.   LC alerted RN, Jamesetta Orleans to observe infant pushing out nipple with tongue. We decided to wait for next feeding to observe how she is with both bottle and breast.  All questions answered at the end of the visit  Maternal Data Has patient been taught Hand Expression?: Yes  Feeding Mother's Current Feeding Choice: Breast Milk and Formula  LATCH Score                    Lactation Tools Discussed/Used Tools: Pump;Flanges Breast pump type: Manual Pump Education: Setup, frequency, and cleaning;Milk Storage Reason for Pumping: increase stimulation Pumping frequency: every 3 hrs 10 min on each breast  Interventions Interventions: Breast feeding basics reviewed;Hand pump;Skin to skin;Support pillows;Breast massage;Hand express;Expressed milk;Education;Pre-pump if needed;Pace feeding  Discharge Pump: Manual  Consult Status Consult Status: Follow-up Date: 10/05/20 Follow-up type: In-patient    Elizabeth Perkins  Elizabeth Perkins 10/04/2020, 3:37 PM

## 2020-10-04 NOTE — Lactation Note (Signed)
This note was copied from a baby's chart. Lactation Consultation Note  Patient Name: Elizabeth Perkins [id number] Date: 10/04/2020 Reason for consult: Initial assessment;1st time breastfeeding;Term (C/S delivery, infant had large emesis ( mucus).) Age:37 hours Per mom, infant has not latch at the breast she has made a few attempts. Mom latched infant on her right breast using the football hold position, infant briefly latched for 3 minutes, not sustaining latch. Mom hand express afterwards and infant was given 3 mls of colostrum. After receiving 3 mls of colostrum, infant had a large emesis ( mucus and coffee ground) in color.  LC informed RN of infant's emesis. Mom was doing skin to skin with infant when [facility] left the room.  LC discussed infant's input and output with parents. Mom made aware of O/P services, breastfeeding support groups, community resources, and our phone # for post-discharge questions.   Mom's plan: 1- Mom knows to breastfeed infant according to cues, 8 to 12+ or more times within 24 hours, skin to skin. 2- Mom pre-pump breast with hand pump prior to latching infant due to having flat nipples. 3- Mom knows to ask RN or LC for latch assistance if needed. 4- Mom will continue to do lots of skin to skin with infant and hand express and give infant back volume if infant doesn't latch.  Maternal Data Has patient been taught Hand Expression?: Yes Does the patient have breastfeeding experience prior to this delivery?: No  Feeding Mother's Current Feeding Choice: Breast Milk  LATCH Score Latch: Repeated attempts needed to sustain latch, nipple held in mouth throughout feeding, stimulation needed to elicit sucking reflex.  Audible Swallowing: A few with stimulation  Type of Nipple: Flat  Comfort (Breast/Nipple): Soft / non-tender  Hold (Positioning): Assistance needed to correctly position infant at breast and maintain latch.  LATCH Score: 6   Lactation Tools  Discussed/Used Tools: Pump Breast pump type: Manual Pump Education: Setup, frequency, and cleaning;Milk Storage Reason for Pumping: Mom will pre-pump breast prior to latching infant due to having flat nipples. Pumping frequency: pre-pump breast prior to latching infant.  Interventions Interventions: Breast feeding basics reviewed;Assisted with latch;Skin to skin;Pre-pump if needed;Breast massage;Hand express;Expressed milk;Position options;Support pillows;Adjust position;Breast compression;Hand pump  Discharge Pump: Personal;Manual WIC Program: Yes  Consult Status Consult Status: Follow-up Date: 10/04/20 Follow-up type: In-patient    Elizabeth Perkins 10/04/2020, 12:29 AM

## 2020-10-05 MED ORDER — FERROUS SULFATE 325 (65 FE) MG PO TABS
325.0000 mg | ORAL_TABLET | ORAL | Status: DC
  Administered 2020-10-05: 325 mg via ORAL
  Filled 2020-10-05: qty 1

## 2020-10-05 NOTE — Discharge Summary (Addendum)
Postpartum Discharge Summary     Patient Name: Elizabeth Perkins DOB: [birth year] MRN: [id number]  Date of admission: 10/02/2020 Delivery date: 10/03/2020  Delivering provider: Griffin Basil  Date of discharge: 10/06/2020  Admitting diagnosis: Post term pregnancy at [redacted] weeks gestation [O48.0, Z3A.41] Intrauterine pregnancy: [redacted]w[redacted]d     Secondary diagnosis:  Principal Problem:   Cesarean delivery delivered Active Problems:   Cystic fibrosis gene carrier   AMA (advanced maternal age) primigravida 35+, unspecified trimester   Post term pregnancy at [redacted] weeks gestation  Additional problems: Acute blood loss anemia    Discharge diagnosis: Term Pregnancy Delivered                                              Post partum procedures: None Augmentation: Cytotec Complications: None  Hospital course: Induction of Labor With Cesarean Section   35 y.o. yo G1P1001 at [redacted]w[redacted]d was admitted to the hospital 10/02/2020 for induction of labor. Patient had a labor course that initiated with Cytotec, followed by SROM and patient progressed to complete. The patient ultimately went for cesarean section due to  failure to descend . Delivery details are as follows: Membrane Rupture Time/Date: 8:33 PM ,10/02/2020   Delivery Method:C-Section, Low Transverse  Details of operation can be found in separate operative Note.  Patient had an uncomplicated postpartum course. She is ambulating, tolerating a regular diet, passing flatus, and urinating well.  Patient is discharged home in stable condition on 10/06/20.      Newborn Data: Birth date:10/03/2020  Birth time:3:56 PM  Gender:Female  Living status:Living  Apgars:9 ,10  Weight:3121 g                                Magnesium Sulfate received: No BMZ received: No Rhophylac: N/A MMR: N/A - Immune T-DaP: Given prenatally Flu: Yes - prenatally Transfusion: No  Physical exam  Vitals:   10/04/20 2009 10/05/20 1402 10/05/20 2103 10/06/20 0522  BP: 110/65  102/67 113/62 118/64  Pulse: 80 77 76 71  Resp: $Remo'15 16 17 15  'beBtM$ Temp: 98.6 F (37 C) 98.3 F (36.8 C) 98.1 F (36.7 C) 98.1 F (36.7 C)  TempSrc:  Oral Oral Oral  SpO2: 99%  100%   Weight:      Height:       General: alert, cooperative, and no distress Lochia: appropriate Uterine Fundus: firm Incision: Healing well with no significant drainage, No significant erythema, Dressing is clean, dry, and intact DVT Evaluation: No cords or calf tenderness. No significant calf/ankle edema.  Labs: Lab Results  Component Value Date   WBC 17.0 (H) 10/04/2020   HGB 8.6 (L) 10/04/2020   HCT 26.5 (L) 10/04/2020   MCV 79.3 (L) 10/04/2020   PLT 239 10/04/2020   CMP Latest Ref Rng & Units 10/04/2020  Creatinine 0.44 - 1.00 mg/dL 0.85   Edinburgh Score: Edinburgh Postnatal Depression Scale Screening Tool 10/04/2020  I have been able to laugh and see the funny side of things. 0  I have looked forward with enjoyment to things. 0  I have blamed myself unnecessarily when things went wrong. 1  I have been anxious or worried for no good reason. 1  I have felt scared or panicky for no good reason. 0  Things have been getting on  top of me. 1  I have been so unhappy that I have had difficulty sleeping. 0  I have felt sad or miserable. 0  I have been so unhappy that I have been crying. 0  The thought of harming myself has occurred to me. 0  Edinburgh Postnatal Depression Scale Total 3     After visit meds:  Allergies as of 10/06/2020   No Known Allergies      Medication List     STOP taking these medications    SUMAtriptan 25 MG tablet Commonly known as: IMITREX       TAKE these medications    acetaminophen 325 MG tablet Commonly known as: TYLENOL Take 2 tablets (650 mg total) by mouth every 4 (four) hours as needed for mild pain (temperature > 101.5.).   ferrous sulfate 325 (65 FE) MG tablet Take 1 tablet (325 mg total) by mouth every other day. Start taking on: October 07, 2020    ibuprofen 600 MG tablet Commonly known as: ADVIL Take 1 tablet (600 mg total) by mouth every 6 (six) hours as needed for moderate pain.   oxyCODONE 5 MG immediate release tablet Commonly known as: Oxy IR/ROXICODONE Take 1-2 tablets (5-10 mg total) by mouth every 4 (four) hours as needed for moderate pain.   Prenatal Vitamin 27-0.8 MG Tabs 1 tab daily               Discharge Care Instructions  (From admission, onward)           Start     Ordered   10/06/20 0000  Discharge wound care:       Comments: You may remove the dressing over your incision 5 days after your surgery date.   10/06/20 1144            Discharge home in stable condition Infant Feeding: Breast Infant Disposition: home with mother Discharge instruction: per After Visit Summary and Postpartum booklet. Activity: Advance as tolerated. Pelvic rest for 6 weeks.  Diet: routine diet Future Appointments: Future Appointments  Date Time Provider [place]  11/06/2020  1:50 PM Radene Gunning, MD [facility]   Follow up Visit: Message sent by Dr. Gwenlyn Perking on 8/21  Please schedule this patient for a In person postpartum visit in 4 weeks with the following provider: Any provider. Additional Postpartum F/U: Incision check 1 week  Low risk pregnancy complicated by:  N/A Delivery mode:  C-Section, Low Transverse  Anticipated Birth Control:  Condoms   GME ATTESTATION:  I saw and evaluated the patient. I agree with the findings and the plan of care as documented in the resident's note. I have made changes to documentation as necessary.  Vilma Meckel, MD OB Fellow, [place] for [place] 10/06/2020 2:57 PM

## 2020-10-05 NOTE — Lactation Note (Addendum)
This note was copied from a baby's chart. Lactation Consultation Note  Patient Name: Elizabeth Perkins [id number] Date: 10/05/2020 Reason for consult: Follow-up assessment;Term;Primapara;1st time breastfeeding Age:37 hours   P1 mother whose infant is now 62 hours old.  This is a term baby at 41+2 weeks.  Mother's feeding preference is breast/formula.  Mother has been primarily formula feeding.  Reviewed feeding plan and stressed the importance of putting baby to the breast at every feeding prior to giving formula supplementation.  Mother will follow with formula supplementation (her desire) and then pump for 15 minutes after every feeding.  Encouraged lots of hand expression before/after feeding and pumping to help with milk supply. Asked mother to provide any EBM she obtains to baby.  Mother is not obtaining any EBM at this time.  Reassurance provided.  Suggested she call her RN/LC for latch assistance as needed.   Mother has a DEBP for home use.  No support person present at this time.   Maternal Data Has patient been taught Hand Expression?: Yes Does the patient have breastfeeding experience prior to this delivery?: No  Feeding Mother's Current Feeding Choice: Breast Milk and Formula  LATCH Score                    Lactation Tools Discussed/Used    Interventions Interventions: Education  Discharge    Consult Status Consult Status: Follow-up Date: 10/06/20 Follow-up type: In-patient    Dora Sims 10/05/2020, 3:53 PM

## 2020-10-05 NOTE — Progress Notes (Signed)
Post Partum Day 2 - pLTCS Subjective: Patient reports she is doing well this AM. Pain is controlled with Ibuprofen. She is eating, drinking, and voiding well without issue. Her bleeding is minimal. She is passing flatus. She denies dizziness/lightheadedness. She has no concerns this AM.  Objective: Blood pressure 110/65, pulse 80, temperature 98.6 F (37 C), resp. rate 15, height 5\' 1"  (1.549 m), weight 84 kg, last menstrual period 12/19/2019, SpO2 99 %, unknown if currently breastfeeding.  Physical Exam:  General: alert, cooperative, and no distress Lochia: appropriate Uterine Fundus: firm Incision: healing well, no significant drainage, dressing is clean, dry, and intact  DVT Evaluation: no LE edema or calf tenderness to palpation  Recent Labs    10/02/20 1309 10/04/20 0519  HGB 11.2* 8.6*  HCT 34.6* 26.5*    Assessment/Plan: 35 year old G1P1001 s/p pLTCS on POD#2.   Patient is doing well and meeting postpartum milestones. VSS.   Anemia: Hgb 8.6 post-op. Asymptomatic. Did not receive Venofer yesterday. Will start PO iron. Contraception: Condoms  Feeding: Breast; doing better with this today  Dispo: Stable for discharge; however, infant may be staying. Will pend discharge orders until discharge status for infant is known.   LOS: 3 days   31, MD [facility] Fellow  Faculty Practice 10/05/2020, 11:26 AM

## 2020-10-05 NOTE — Lactation Note (Signed)
This note was copied from a baby's chart. Lactation Consultation Note  Patient Name: Elizabeth Perkins [id number] Date: 10/05/2020 Reason for consult: Follow-up assessment;Mother's request;Difficult latch;1st time breastfeeding;Term -4 % weight loss. Age:37 hours, C/S delivery. Infant has mostly been formula feeding today, per mom, infant had two episodes of being spitty late yesterday evening and most intake of formula has been 5 mls .  Mom would like assistance with latching infant at the breast. Mom was given 20 mm NS earlier due to mom  having flat nipples, mom pre-pump breast with hand pump prior to latching infant at the breast. Mom latched infant on her right breast using 20 mm NS that was pre-filled with 0.5 ml of formula, after few attempts infant sustained latch and breastfeed for 15 minutes. Mom will continue to work towards latching infant at breast and will ask RN or LC for assistance if needed. LC set mom up with DEBP tonight and explained how to use, Mom was pumping with when LC left the room. As LC left the room dad was supplementing infant with formula using a slow flow bottle nipple ( purple & gold). Mom shown how to use DEBP & how to disassemble, clean, & reassemble parts.  Mom understand feeding plan may change based on infant feeding behaviors. Mom's plan: 1- Mom will breastfeed infant according to cues, 8 to 12+ or more times within 24 hours, skin to skin every feeding going forward. 2- Pre-pump breast with hand pump and apply 20 mm NS and ask for assistance if needed. 3- Mom will start using DEBP and pump every 3 hours for 15 minutes on initial setting and give infant back any EBM first before offering formula. 4- Mom will continue to supplement infant with EBM/ then formula and based on infant age and hours of life Day 2, if latched 7 -15 mls per feeding and if formula only Day 2 ( 15-30 mls ) per feeding.  Maternal Data    Feeding Mother's Current Feeding Choice: Breast  Milk and Formula Nipple Type: Extra Slow Flow  LATCH Score Latch: Grasps breast easily, tongue down, lips flanged, rhythmical sucking. (Infant sustained latch with 20 mm NS)  Audible Swallowing: A few with stimulation  Type of Nipple: Flat  Comfort (Breast/Nipple): Soft / non-tender  Hold (Positioning): Assistance needed to correctly position infant at breast and maintain latch.  LATCH Score: 7   Lactation Tools Discussed/Used Tools: Pump Nipple shield size: 20 Flange Size: 27 Breast pump type: Double-Electric Breast Pump Pump Education: Setup, frequency, and cleaning;Milk Storage Reason for Pumping: Infant poor feeder and not sustain latch, mom has mostly been formula feeding and infant with hx of being spitty. Pumping frequency: Mom will pump every 3 hours for 15 minutes on inital setting.  Interventions Interventions: Position options;Support pillows;Adjust position;Breast compression;Skin to skin;Pre-pump if needed;Education;DEBP;Pace feeding  Discharge Pump: DEBP;Manual  Consult Status Consult Status: Follow-up Date: 10/05/20 Follow-up type: In-patient    [name] 10/05/2020, 1:27 AM

## 2020-10-06 ENCOUNTER — Telehealth: Payer: Self-pay | Admitting: *Deleted

## 2020-10-06 MED ORDER — ACETAMINOPHEN 325 MG PO TABS: 650.0000 mg | ORAL_TABLET | ORAL | 0 refills | Status: DC | PRN

## 2020-10-06 MED ORDER — OXYCODONE HCL 5 MG PO TABS: 5.0000 mg | ORAL_TABLET | ORAL | 0 refills | Status: DC | PRN

## 2020-10-06 MED ORDER — IBUPROFEN 600 MG PO TABS: 600.0000 mg | ORAL_TABLET | Freq: Four times a day (QID) | ORAL | 0 refills | Status: DC | PRN

## 2020-10-06 MED ORDER — FERROUS SULFATE 325 (65 FE) MG PO TABS: 325.0000 mg | ORAL_TABLET | ORAL | 3 refills | Status: DC

## 2020-10-06 NOTE — Telephone Encounter (Signed)
Left patient an urgent message to call and schedule 1 week incision check from C/S delivery date of 10/03/2020.

## 2020-10-06 NOTE — Telephone Encounter (Signed)
-----   Message from Kathie Dike, [place] sent at 10/06/2020  8:02 AM EDT ----- Regarding: FW: Postpartum appointment  ----- Message ----- From: Worthy Rancher, MD Sent: 10/05/2020  11:38 AM EDT To: [facility] [employer] Subject: Postpartum appointment                          Please schedule this patient for a In person postpartum visit in 4 weeks with the following provider: Any provider. Additional Postpartum F/U:Incision check 1 week  Low risk pregnancy complicated by:  N/A Delivery mode: C-Section, Low Transverse  Anticipated Birth Control: Condoms

## 2020-10-06 NOTE — Lactation Note (Signed)
This note was copied from a baby's chart. Lactation Consultation Note  Patient Name: Elizabeth Perkins [id number] Date: 10/06/2020 Reason for consult: Follow-up assessment;Term;Primapara;1st time breastfeeding Age:37 hours   P1 mother whose infant is now 61 hours old.  This is a term baby at 41+2 weeks.  Mother's feeding preference is breast/formula.  Baby is under phototherapy via bili blanket.  The last bilirubin level was 11.5 mg/dl at 63 hours of life (low intermediate risk zone).  Mother has been primarily formula feeding due to jaundice level.  Encouraged to continue latching to the breast prior to giving formula supplementation.  Volumes are now 30-60 mls/feeding.  Baby has had multiple voids/stools.  Mother has been pumping and obtaining only colostrum drops at this time.  Suggested she pump every three hours to help ensure a good milk supply.  Mother verbalized understanding.  Mother has a DEBP for home use.  Parents receptive to teaching.  They have our OP phone number for any questions/concerns after discharge.   Maternal Data Has patient been taught Hand Expression?: Yes Does the patient have breastfeeding experience prior to this delivery?: No  Feeding Mother's Current Feeding Choice: Breast Milk and Formula  LATCH Score                    Lactation Tools Discussed/Used Flange Size: 24;27 Breast pump type: Double-Electric Breast Pump;Manual Pumping frequency: Every three hours Pumped volume:  (drops)  Interventions Interventions: Education  Discharge Discharge Education: Engorgement and breast care Pump: Manual;Personal;DEBP  Consult Status Consult Status: Complete Date: 10/06/20 Follow-up type: Call as needed    Christeen Lai R Yorley Buch 10/06/2020, 7:56 AM

## 2020-10-07 ENCOUNTER — Telehealth: Payer: Self-pay

## 2020-10-07 NOTE — Telephone Encounter (Signed)
Transition Care Management Follow-up Telephone Call Date of discharge and from where: 10/06/2020-[facility] How have you been since you were released from the hospital? Patient stated she is doing fine.  Any questions or concerns? No  Items Reviewed: Did the pt receive and understand the discharge instructions provided? Yes  Medications obtained and verified? Yes  Other? No  Any new allergies since your discharge? No  Dietary orders reviewed? N/A Do you have support at home? Yes   Home Care and Equipment/Supplies: Were home health services ordered? not applicable If so, what is the name of the agency? N/A  Has the agency set up a time to come to the patient's home? not applicable Were any new equipment or medical supplies ordered?  No What is the name of the medical supply agency? N/A Were you able to get the supplies/equipment? not applicable Do you have any questions related to the use of the equipment or supplies? No  Functional Questionnaire: (I = Independent and D = Dependent) ADLs: I  Bathing/Dressing- I  Meal Prep- I  Eating- I  Maintaining continence- I  Transferring/Ambulation- I  Managing Meds- I  Follow up appointments reviewed:  PCP Hospital f/u appt confirmed? No   Specialist Hospital f/u appt confirmed? Yes  Scheduled to see Dr. Para March on 11/06/2020 @ 1:50 pm. Are transportation arrangements needed? No  If their condition worsens, is the pt aware to call PCP or go to the Emergency Dept.? Yes Was the patient provided with contact information for the PCP's office or ED? Yes Was to pt encouraged to call back with questions or concerns? Yes

## 2020-10-10 ENCOUNTER — Other Ambulatory Visit: Payer: Self-pay

## 2020-10-10 ENCOUNTER — Ambulatory Visit (INDEPENDENT_AMBULATORY_CARE_PROVIDER_SITE_OTHER): Payer: 59 | Admitting: Certified Nurse Midwife

## 2020-10-10 ENCOUNTER — Encounter: Payer: Self-pay | Admitting: Certified Nurse Midwife

## 2020-10-10 VITALS — BP 115/60 | HR 67 | Resp 16 | Ht 61.5 in | Wt 181.0 lb

## 2020-10-10 DIAGNOSIS — Z98891 History of uterine scar from previous surgery: Secondary | ICD-10-CM

## 2020-10-10 DIAGNOSIS — Z5189 Encounter for other specified aftercare: Secondary | ICD-10-CM

## 2020-10-10 NOTE — Progress Notes (Signed)
Subjective:    Elizabeth Perkins is a 35 y.o. female who presents for a incision check. She is 1 weeks postpartum following a low cervical transverse Cesarean section. She denies redness, drainage, or edema of incision. Her pain is minimal and well controlled with Ibuprofen.  Review of Systems Pertinent items are noted in HPI.   Objective:   BP 115/60   Pulse 67   Resp 16   Ht 5' 1.5" (1.562 m)   Wt 181 lb (82.1 kg)   BMI 33.65 kg/m   General: alert, cooperative and no distress  Lungs:  Regular WOB  Heart:  Regular rate  Skin:  Low transverse abd incision well healed and approximated, no edema, erythema, or drainage. Steri strips intact.   Assessment:  S/p Cesarean Section Incision check  Plan:  Remove steri strips at 2 weeks Return for postpartum visit as scheduled    Donette Larry, [place]  10/10/2020 10:57 AM

## 2020-10-13 ENCOUNTER — Telehealth (HOSPITAL_COMMUNITY): Payer: Self-pay

## 2020-10-13 NOTE — Telephone Encounter (Signed)
No answer. Left message to return nurse call.  Elizabeth Perkins [facility] 10/13/2020,1908

## 2020-10-18 ENCOUNTER — Other Ambulatory Visit: Payer: Self-pay | Admitting: Family Medicine

## 2020-11-05 NOTE — Progress Notes (Signed)
    Post Partum Visit Note  Elizabeth Perkins is a 35 y.o. G77P1001 female who presents for a postpartum visit. She is 5 weeks postpartum following a primary cesarean section.  I have fully reviewed the prenatal and intrapartum course. The delivery was at 41.2 gestational weeks.  Anesthesia: epidural. Postpartum course has been unremarkable. Baby is doing well. Baby is feeding by bottle - Gerber Soothe Pro . Bleeding no bleeding. Bowel function is normal. Bladder function is normal. Patient is not sexually active. Contraception method is none. Postpartum depression screening: negative.   The pregnancy intention screening data noted above was reviewed. Potential methods of contraception were discussed. The patient elected to proceed with No data recorded.    Health Maintenance Due  Topic Date Due   COVID-19 Vaccine (3 - Booster for Pfizer series) 04/18/2020   INFLUENZA VACCINE  09/15/2020    The following portions of the patient's history were reviewed and updated as appropriate: allergies, current medications, past family history, past medical history, past social history, past surgical history, and problem list.  Review of Systems Pertinent items are noted in HPI.  Objective:  There were no vitals taken for this visit.   General:  alert and no distress   Breasts:  not indicated  Lungs: Normal respiratory note  Heart:  regular rate and rhythm  Abdomen: soft, non-tender; bowel sounds normal; no masses,  no organomegaly   Wound well approximated incision  GU exam:  not indicated       Assessment:    1. HGSIL (high grade squamous intraepithelial lesion) on Pap smear of cervix - Recommended colposcopy today - she declines for today. She would like to come back at a future visit.   2. Cesarean delivery delivered - Well healed   6 wks postpartum exam.   Plan:   Essential components of care per ACOG recommendations:  1.  Mood and well being: Patient with negative depression  screening today. Reviewed local resources for support.  - Patient tobacco use? No.   - hx of drug use? No.    2. Infant care and feeding:  -Patient currently breastmilk feeding? No.  -Social determinants of health (SDOH) reviewed in EPIC. No concerns  3. Sexuality, contraception and birth spacing - Patient does not want a pregnancy in the next year.  Desired family size is 2 children.  - Reviewed forms of contraception in tiered fashion. Patient desired no method today.   - Discussed birth spacing of 18 months  4. Sleep and fatigue -Encouraged family/partner/community support of 4 hrs of uninterrupted sleep to help with mood and fatigue  5. Physical Recovery  - Discussed patients delivery and complications. She describes her labor as good. - Patient had a C-section failure to progress. She pushed for 2 hours and no descent.  - Patient has urinary incontinence? No. - Patient is safe to resume physical and sexual activity  6.  Health Maintenance - HM due items addressed Yes - Last pap smear  Diagnosis  Date Value Ref Range Status  02/28/2020 (A)  Final   - High grade squamous intraepithelial lesion (HSIL)   Pap smear not done at today's visit. She will reschedule for colposcopy -Breast Cancer screening indicated? No.   7. Chronic Disease/Pregnancy Condition follow up: None  - PCP follow up  Milas Hock, MD

## 2020-11-06 ENCOUNTER — Ambulatory Visit (INDEPENDENT_AMBULATORY_CARE_PROVIDER_SITE_OTHER): Payer: 59 | Admitting: Obstetrics and Gynecology

## 2020-11-06 ENCOUNTER — Encounter: Payer: Self-pay | Admitting: Obstetrics and Gynecology

## 2020-11-06 ENCOUNTER — Other Ambulatory Visit: Payer: Self-pay

## 2020-11-06 VITALS — BP 93/56 | HR 78 | Wt 171.0 lb

## 2020-11-06 DIAGNOSIS — R87613 High grade squamous intraepithelial lesion on cytologic smear of cervix (HGSIL): Secondary | ICD-10-CM | POA: Diagnosis not present

## 2020-11-25 ENCOUNTER — Encounter: Payer: Self-pay | Admitting: *Deleted

## 2021-01-13 DIAGNOSIS — R87810 Cervical high risk human papillomavirus (HPV) DNA test positive: Secondary | ICD-10-CM | POA: Diagnosis not present

## 2021-01-13 DIAGNOSIS — R87611 Atypical squamous cells cannot exclude high grade squamous intraepithelial lesion on cytologic smear of cervix (ASC-H): Secondary | ICD-10-CM | POA: Diagnosis not present

## 2021-01-13 DIAGNOSIS — R87613 High grade squamous intraepithelial lesion on cytologic smear of cervix (HGSIL): Secondary | ICD-10-CM | POA: Diagnosis not present

## 2021-01-13 DIAGNOSIS — E559 Vitamin D deficiency, unspecified: Secondary | ICD-10-CM | POA: Diagnosis not present

## 2021-01-13 DIAGNOSIS — Z0000 Encounter for general adult medical examination without abnormal findings: Secondary | ICD-10-CM | POA: Diagnosis not present

## 2021-01-13 DIAGNOSIS — E782 Mixed hyperlipidemia: Secondary | ICD-10-CM | POA: Diagnosis not present

## 2021-03-18 DIAGNOSIS — N871 Moderate cervical dysplasia: Secondary | ICD-10-CM | POA: Diagnosis not present

## 2021-03-18 DIAGNOSIS — R87613 High grade squamous intraepithelial lesion on cytologic smear of cervix (HGSIL): Secondary | ICD-10-CM | POA: Diagnosis not present

## 2021-03-18 DIAGNOSIS — Z01812 Encounter for preprocedural laboratory examination: Secondary | ICD-10-CM | POA: Diagnosis not present

## 2021-03-25 DIAGNOSIS — R7989 Other specified abnormal findings of blood chemistry: Secondary | ICD-10-CM | POA: Diagnosis not present

## 2021-04-01 DIAGNOSIS — N871 Moderate cervical dysplasia: Secondary | ICD-10-CM | POA: Diagnosis not present

## 2021-04-01 DIAGNOSIS — R87613 High grade squamous intraepithelial lesion on cytologic smear of cervix (HGSIL): Secondary | ICD-10-CM | POA: Diagnosis not present

## 2021-04-01 DIAGNOSIS — Z01818 Encounter for other preprocedural examination: Secondary | ICD-10-CM | POA: Diagnosis not present

## 2021-05-10 ENCOUNTER — Emergency Department
Admission: EM | Admit: 2021-05-10 | Discharge: 2021-05-10 | Disposition: A | Discharge status: Home / Self Care | Payer: 59 | Source: Home / Self Care

## 2021-05-10 ENCOUNTER — Other Ambulatory Visit: Payer: Self-pay

## 2021-05-10 ENCOUNTER — Emergency Department (INDEPENDENT_AMBULATORY_CARE_PROVIDER_SITE_OTHER): Payer: 59

## 2021-05-10 DIAGNOSIS — M79671 Pain in right foot: Secondary | ICD-10-CM

## 2021-05-10 HISTORY — DX: Plantar fascial fibromatosis: M72.2

## 2021-05-10 MED ORDER — METHYLPREDNISOLONE 4 MG PO TBPK: ORAL_TABLET | ORAL | 0 refills | Status: DC

## 2021-05-10 NOTE — ED Triage Notes (Addendum)
Pt presents to Urgent Care with c/o R foot pain to medial sole area  x 3 days. No known injury. Noted to be swollen. Pt having difficulty walking. Hx of plantar fasciitis.   ?

## 2021-05-10 NOTE — ED Provider Notes (Signed)
?[facility] ? ? ? ?CSN: [id number] ?Arrival date & time: 05/10/21  1509 ? ? ?  ? ?History   ?Chief Complaint ?Chief Complaint  ?Patient presents with  ? Foot Pain  ? ? ?HPI ?Elizabeth Perkins is a 36 y.o. female.  ? ?HPI Evan-year-old female presents with right foot pain and medial sole for 3 days.  No injury or insult noted.  Patient reports difficulty and increased pain when ambulating.  Patient reports history of plantar fasciitis. ? ?Past Medical History:  ?Diagnosis Date  ? Abnormal Pap smear of cervix   ? HGSIL 2019 Never followed up  ? HPV in female   ? Plantar fasciitis, right   ? Vaginal Pap smear, abnormal   ? ASCUS  ? Vitamin D deficiency   ? ? ?Patient Active Problem List  ? Diagnosis Date Noted  ? Cesarean delivery delivered 10/05/2020  ? Cystic fibrosis gene carrier 04/14/2020  ? HGSIL (high grade squamous intraepithelial lesion) on Pap smear of cervix 12/15/2016  ? ? ?Past Surgical History:  ?Procedure Laterality Date  ? CESAREAN SECTION N/A 10/03/2020  ? Procedure: CESAREAN SECTION;  Surgeon: Warden Fillers, MD;  Location: [facility] LD ORS;  Service: Obstetrics;  Laterality: N/A;  ? COLPOSCOPY    ? ? ?OB History   ? ? Gravida  ?1  ? Para  ?1  ? Term  ?1  ? Preterm  ?   ? AB  ?   ? Living  ?1  ?  ? ? SAB  ?   ? IAB  ?   ? Ectopic  ?   ? Multiple  ?0  ? Live Births  ?1  ?   ?  ?  ? ? ? ?Home Medications   ? ?Prior to Admission medications   ?Medication Sig Start Date End Date Taking? Authorizing Provider  ?methylPREDNISolone (MEDROL DOSEPAK) 4 MG TBPK tablet Take as directed 05/10/21  Yes Trevor Iha, FNP  ?acetaminophen (TYLENOL) 325 MG tablet Take 2 tablets (650 mg total) by mouth every 4 (four) hours as needed for mild pain (temperature > 101.5.). ?Patient not taking: Reported on 11/06/2020 10/06/20   Worthy Rancher, MD  ?ferrous sulfate 325 (65 FE) MG tablet Take 1 tablet (325 mg total) by mouth every other day. ?Patient not taking: Reported on 11/06/2020 10/07/20   Worthy Rancher, MD   ?ibuprofen (ADVIL) 600 MG tablet Take 1 tablet (600 mg total) by mouth every 6 (six) hours as needed for moderate pain. 10/06/20   Worthy Rancher, MD  ?oxyCODONE (OXY IR/ROXICODONE) 5 MG immediate release tablet Take 1-2 tablets (5-10 mg total) by mouth every 4 (four) hours as needed for moderate pain. ?Patient not taking: Reported on 10/10/2020 10/06/20   Worthy Rancher, MD  ?Prenatal Vit-Fe Fumarate-FA (PRENATAL VITAMIN) 27-0.8 MG TABS 1 tab daily 02/28/20   Jerene Bears, MD  ? ? ?Family History ?Family History  ?Problem Relation Age of Onset  ? Healthy Mother   ? Healthy Father   ? ? ?Social History ?Social History  ? ?Tobacco Use  ? Smoking status: Never  ? Smokeless tobacco: Never  ?Vaping Use  ? Vaping Use: Never used  ?Substance Use Topics  ? Alcohol use: Yes  ?  Comment: rare  ? Drug use: Never  ? ? ? ?Allergies   ?Patient has no known allergies. ? ? ?Review of Systems ?Review of Systems ? ? ?Physical Exam ?Triage Vital Signs ?ED Triage Vitals  ?Enc  Vitals Group  ?   BP 05/10/21 1615 110/69  ?   Pulse Rate 05/10/21 1615 82  ?   Resp 05/10/21 1615 20  ?   Temp 05/10/21 1615 98.5 ?F (36.9 ?C)  ?   Temp Source 05/10/21 1615 Oral  ?   SpO2 05/10/21 1615 100 %  ?   Weight 05/10/21 1609 180 lb (81.6 kg)  ?   Height 05/10/21 1609 5' 1.5" (1.562 m)  ?   Head Circumference --   ?   Peak Flow --   ?   Pain Score 05/10/21 1609 7  ?   Pain Loc --   ?   Pain Edu? --   ?   Excl. in GC? --   ? ?No data found. ? ?Updated Vital Signs ?BP 110/69 (BP Location: Right Arm)   Pulse 82   Temp 98.5 ?F (36.9 ?C) (Oral)   Resp 20   Ht 5' 1.5" (1.562 m)   Wt 180 lb (81.6 kg)   SpO2 100%   BMI 33.46 kg/m?  ? ?Physical Exam ?Vitals and nursing note reviewed.  ?Constitutional:   ?   General: She is not in acute distress. ?   Appearance: Normal appearance. She is obese. She is not ill-appearing.  ?HENT:  ?   Head: Normocephalic and atraumatic.  ?   Mouth/Throat:  ?   Mouth: Mucous membranes are moist.  ?   Pharynx:  Oropharynx is clear.  ?Eyes:  ?   Extraocular Movements: Extraocular movements intact.  ?   Conjunctiva/sclera: Conjunctivae normal.  ?   Pupils: Pupils are equal, round, and reactive to light.  ?Cardiovascular:  ?   Rate and Rhythm: Normal rate and regular rhythm.  ?   Pulses: Normal pulses.  ?   Heart sounds: Normal heart sounds.  ?Pulmonary:  ?   Effort: Pulmonary effort is normal.  ?   Breath sounds: Normal breath sounds.  ?Musculoskeletal:  ?   Cervical back: Normal range of motion and neck supple.  ?   Comments: Right foot: TTP over superior medial aspect of arch with soft tissue swelling noted  ?Skin: ?   General: Skin is warm and dry.  ?Neurological:  ?   General: No focal deficit present.  ?   Mental Status: She is alert and oriented to person, place, and time. Mental status is at baseline.  ? ? ? ?UC Treatments / Results  ?Labs ?(all labs ordered are listed, but only abnormal results are displayed) ?Labs Reviewed - No data to display ? ?EKG ? ? ?Radiology ?DG Foot Complete Right ? ?Result Date: 05/10/2021 ?CLINICAL DATA:  Pain right foot EXAM: RIGHT FOOT COMPLETE - 3+ VIEW COMPARISON:  None. FINDINGS: No recent fracture or dislocation is seen. There are no focal lytic lesions. Fourth metatarsal is shorter than usual. IMPRESSION: No significant radiographic abnormality is seen in the right foot. Electronically Signed   By: Ernie Avena M.D.   On: 05/10/2021 16:41   ? ?Procedures ?Procedures (including critical care time) ? ?Medications Ordered in UC ?Medications - No data to display ? ?Initial Impression / Assessment and Plan / UC Course  ?I have reviewed the triage vital signs and the nursing notes. ? ?Pertinent labs & imaging results that were available during my care of the patient were reviewed by me and considered in my medical decision making (see chart for details). ? ?  ? ?MDM: 1.  Right foot pain-Advised/informed patient of right foot x-ray results-no acute  osseous abnormality.  Advised  patient to take medication as directed with food to completion.  Encouraged patient to increase daily water intake while taking this medication.  Advised patient may also rice right foot for 25 minutes 2-3 times daily for the next 3 days.  Advised patient if symptoms worsen and/or unresolved please follow-up with [facility] podiatrist (contact information is provided above).  Work note provided to patient prior to discharge this evening.  Patient discharged home, hemodynamically stable. ?Final Clinical Impressions(s) / UC Diagnoses  ? ?Final diagnoses:  ?Foot pain, right  ? ? ? ?Discharge Instructions   ? ?  ?Advised/informed patient of right foot x-ray results-no acute osseous abnormality.  Advised patient to take medication as directed with food to completion.  Encouraged patient to increase daily water intake while taking this medication.  Advised patient may also rice right foot for 25 minutes 2-3 times daily for the next 3 days.  Advised patient if symptoms worsen and/or unresolved please follow-up with [facility] podiatrist (contact information is provided above).  ? ? ? ? ?ED Prescriptions   ? ? Medication Sig Dispense Auth. Provider  ? methylPREDNISolone (MEDROL DOSEPAK) 4 MG TBPK tablet Take as directed 1 each Trevor Ihaagan, Kobi Mario, FNP  ? ?  ? ?PDMP not reviewed this encounter. ?  ?Trevor IhaRagan, Mandel Seiden, FNP ?05/10/21 1713 ? ?

## 2021-05-10 NOTE — Discharge Instructions (Addendum)
Advised/informed patient of right foot x-ray results-no acute osseous abnormality.  Advised patient to take medication as directed with food to completion.  Encouraged patient to increase daily water intake while taking this medication.  Advised patient may also rice right foot for 25 minutes 2-3 times daily for the next 3 days.  Advised patient if symptoms worsen and/or unresolved please follow-up with [facility] podiatrist (contact information is provided above).  ?

## 2021-05-14 ENCOUNTER — Encounter: Payer: Self-pay | Admitting: Podiatry

## 2021-05-14 ENCOUNTER — Ambulatory Visit (INDEPENDENT_AMBULATORY_CARE_PROVIDER_SITE_OTHER): Payer: 59 | Admitting: Podiatry

## 2021-05-14 DIAGNOSIS — M76821 Posterior tibial tendinitis, right leg: Secondary | ICD-10-CM

## 2021-05-14 DIAGNOSIS — M7671 Peroneal tendinitis, right leg: Secondary | ICD-10-CM

## 2021-05-14 MED ORDER — MELOXICAM 15 MG PO TABS: 15.0000 mg | ORAL_TABLET | Freq: Every day | ORAL | 0 refills | Status: DC

## 2021-05-14 NOTE — Progress Notes (Signed)
?  Subjective:  ?Patient ID: Elizabeth Perkins, female    DOB: 03/30/1985,   MRN: [id number] ? ?Chief Complaint  ?Patient presents with  ? Plantar Fasciitis  ?  Went to ER Sunday and they took x-rays and gave me medicine and hurts some in the arch and hurts to stand and is a little sore on the instep and hurts when sitting and my 4th toe on the right was broken when I was little  ? ? ?36 y.o. female presents for concern of  right foot pain. Relates it started several days ago and was seen in urgent care over the weekend. Relates she was unable to walk at that time and has had improvement with medrol dose pack. X-rays were taken and were negative.  Did break fourth toe when she was little. Denies any other pedal complaints. Denies n/v/f/c.  ? ?Past Medical History:  ?Diagnosis Date  ? Abnormal Pap smear of cervix   ? HGSIL 2019 Never followed up  ? HPV in female   ? Plantar fasciitis, right   ? Vaginal Pap smear, abnormal   ? ASCUS  ? Vitamin D deficiency   ? ? ?Objective:  ?Physical Exam: ?Vascular: DP/PT pulses 2/4 bilateral. CFT <3 seconds. Normal hair growth on digits. No edema.  ?Skin. No lacerations or abrasions bilateral feet.  ?Musculoskeletal: MMT 5/5 bilateral lower extremities in DF, PF, Inversion and Eversion. Deceased ROM in DF of ankle joint. Tender to insertion of PT tendon and proximally along tendon. Pain with inversion. Some pain at medial calcaneal tubercle.  ?Neurological: Sensation intact to light touch.  ? ?Assessment:  ? ?1. Posterior tibial tendon dysfunction (PTTD) of right lower extremity   ? ? ? ?Plan:  ?Patient was evaluated and treated and all questions answered. ?X-rays reviewed and discussed with patient. No acute fractures or dislocations noted.  ?Discussed PTTD diagnosis and treatment options with patient. ?Stretching exercises discussed and handout dispensed. ?Prescription for meloxicam provided. ?Tri-Lock ankle brace dispensed. ?Work note provided to be off feet when need rest.   ?Discussed if there is no improvement PT/MRI/injection may be an option. ?Patient to return to clinic in 6 to 8 weeks or sooner if symptoms fail to improve or worsen. ? ? ? ?Louann Sjogren, DPM  ? ? ?

## 2021-05-14 NOTE — Patient Instructions (Signed)
Posterior Tibial Tendinitis Rehab ?Ask your health care provider which exercises are safe for you. Do exercises exactly as told by your health care provider and adjust them as directed. It is normal to feel mild stretching, pulling, tightness, or discomfort as you do these exercises. Stop right away if you feel sudden pain or your pain gets worse. Do not begin these exercises until told by your health care provider. ?Stretching and range-of-motion exercises ?These exercises warm up your muscles and joints and improve the movement and flexibility in your ankle and foot. These exercises may also help to relieve pain. ?Standing wall calf stretch, knee straight ? ?Stand with your hands against a wall. ?Extend your left / right leg behind you, and bend your front knee slightly. If directed, place a folded washcloth under the arch of your foot for support. ?Point the toes of your back foot slightly inward. ?Keeping your heels on the floor and your back knee straight, shift your weight toward the wall. Do not allow your back to arch. You should feel a gentle stretch in your upper left / right calf. ?Hold this position for __________ seconds. ?Repeat __________ times. Complete this exercise __________ times a day. ?Standing wall calf stretch, knee bent ?Stand with your hands against a wall. ?Extend your left / right leg behind you, and bend your front knee slightly. If directed, place a folded washcloth under the arch of your foot for support. ?Point the toes of your back foot slightly inward. ?Unlock your back knee so it is bent. Keep your heels on the floor. You should feel a gentle stretch deep in your lower left / right calf. ?Hold this position for __________ seconds. ?Repeat __________ times. Complete this exercise __________ times a day. ?Strengthening exercises ?These exercises build strength and endurance in your ankle and foot. Endurance is the ability to use your muscles for a long time, even after they get  tired. ?Ankle inversion with band ?Secure one end of a rubber exercise band or tubing to a fixed object, such as a table leg or a pole, that will stay still when the band is pulled. ?Loop the other end of the band around the middle of your left / right foot. ?Sit on the floor facing the object with your left / right leg extended. The band or tube should be slightly tense when your foot is relaxed. ?Leading with your big toe, slowly bring your left / right foot and ankle inward, toward your other foot (inversion). ?Hold this position for __________ seconds. ?Slowly return your foot to the starting position. ?Repeat __________ times. Complete this exercise __________ times a day. ?Towel curls ? ?Sit in a chair on a non-carpeted surface, and put your feet on the floor. ?Place a towel in front of your feet. If told by your health care provider, add a __________ weight to the end of the towel. ?Keeping your heel on the floor, put your left / right foot on the towel. ?Pull the towel toward you by grabbing the towel with your toes and curling them under. Keep your heel on the floor while you do this. ?Let your toes relax. ?Grab the towel with your toes again. Keep going until the towel is completely underneath your foot. ?Repeat __________ times. Complete this exercise __________ times a day. ?Balance exercise ?This exercise improves or maintains your balance. Balance is important in preventing falls. ?Single leg stand ?Without wearing shoes, stand near a railing or in a doorway. You may hold   on to the railing or door frame as needed for balance. ?Stand on your left / right foot. Keep your big toe down on the floor and try to keep your arch lifted. ?If balancing in this position is too easy, try the exercise with your eyes closed or while standing on a pillow. ?Hold this position for __________ seconds. ?Repeat __________ times. Complete this exercise __________ times a day. ?This information is not intended to replace  advice given to you by your health care provider. Make sure you discuss any questions you have with your health care provider. ?Document Revised: 05/30/2018 Document Reviewed: 04/05/2018 ?Elsevier Patient Education ? 2022 [employer] ? ?

## 2021-05-28 ENCOUNTER — Telehealth: Payer: Self-pay | Admitting: *Deleted

## 2021-05-28 NOTE — Telephone Encounter (Signed)
Patient is calling to ask how long does she have to wear brace given. She wanted to let the physician know that her heels are starting to hurt really bad after being on feet for about an hour. Please advise ?

## 2021-05-29 NOTE — Telephone Encounter (Signed)
Patient has been given instructions/recommendations.

## 2021-06-22 IMAGING — US US MFM OB DETAIL+14 WK
1 series · 13 of 28 positions shown · non-contrast
Comparison: none

[Series 1: us mfm ob detail+14 wk · 13 of 86 slices shown]
[im 4/86]
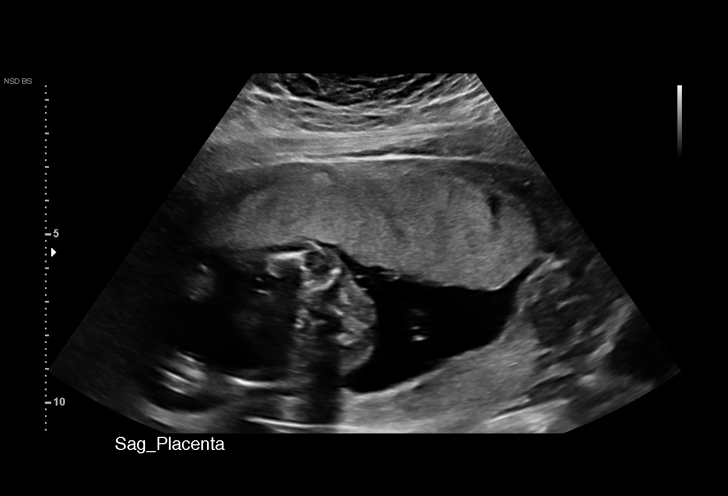
[im 10/86]
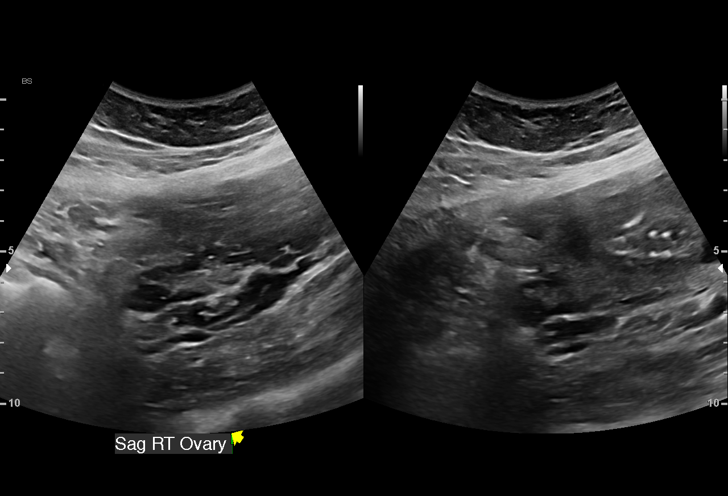
[im 16/86]
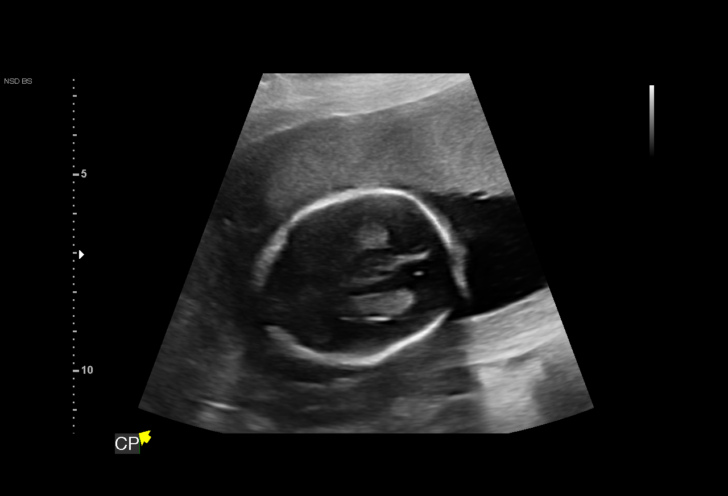
[im 23/86]
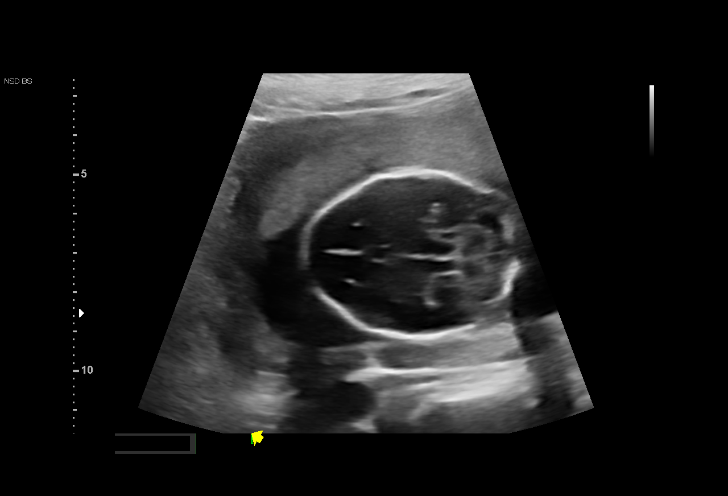
[im 29/86]
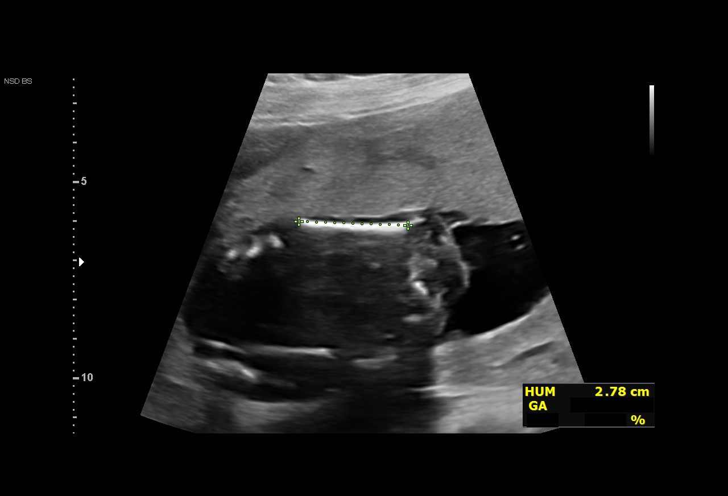
[im 35/86]
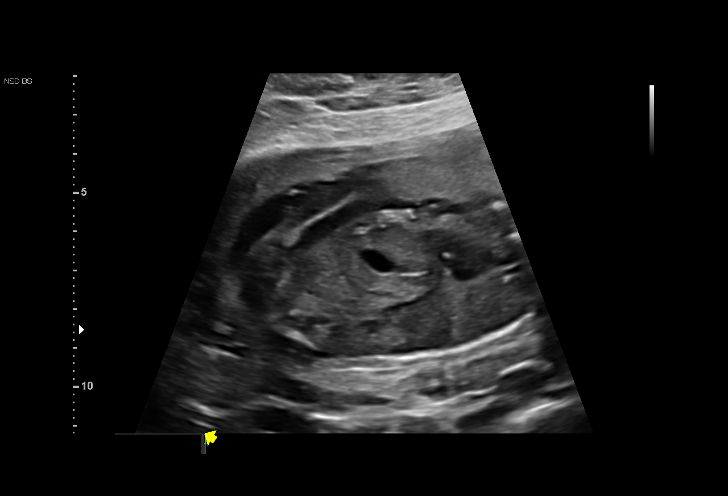
[im 45/86]
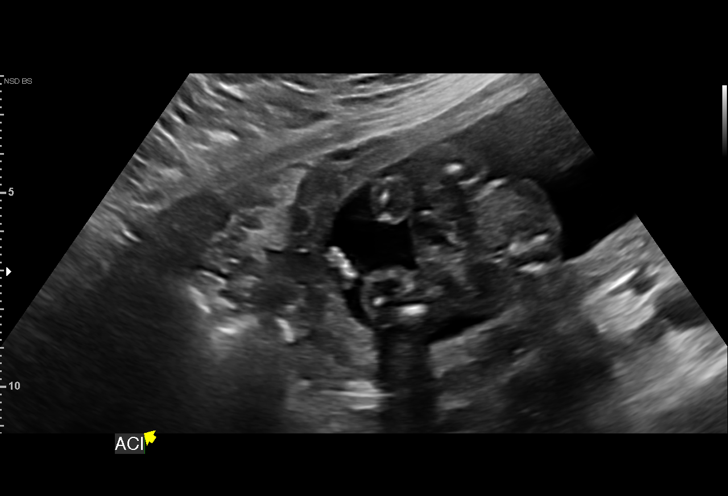
[im 51/86]
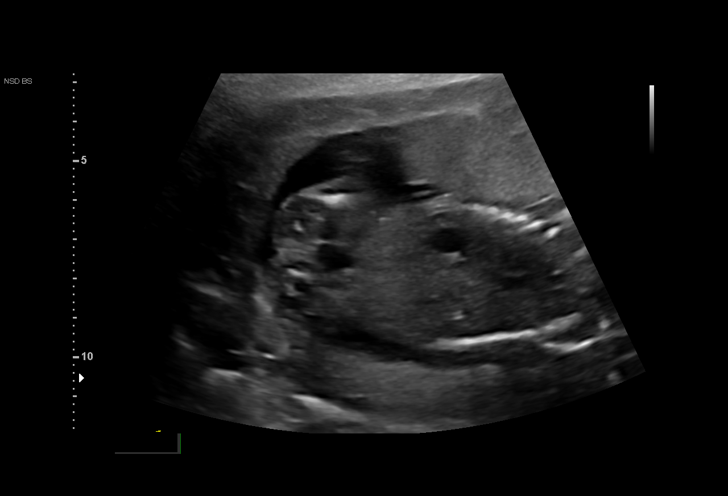
[im 57/86]
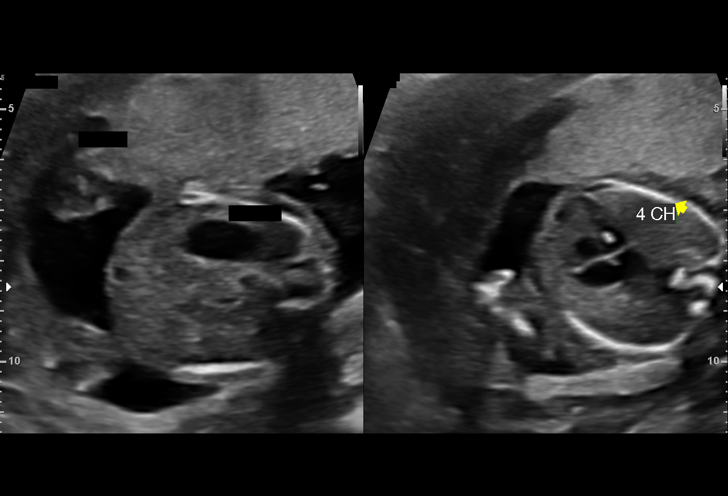
[im 63/86]
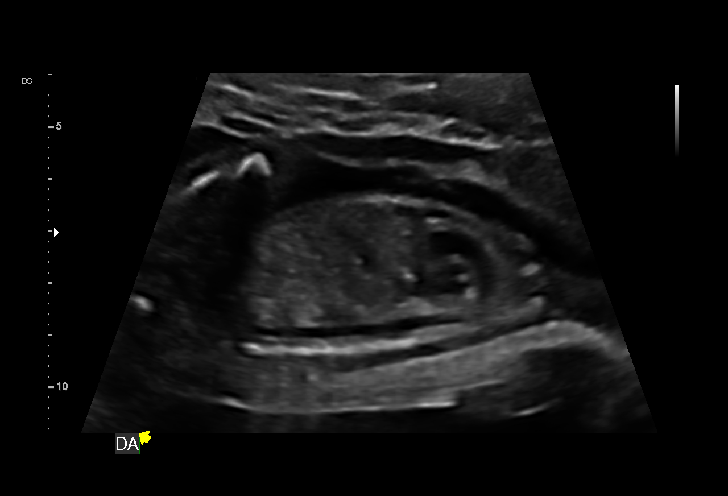
[im 70/86]
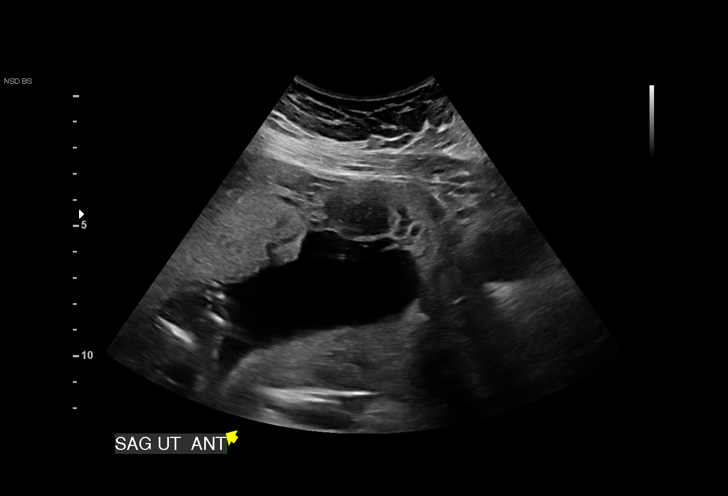
[im 76/86]
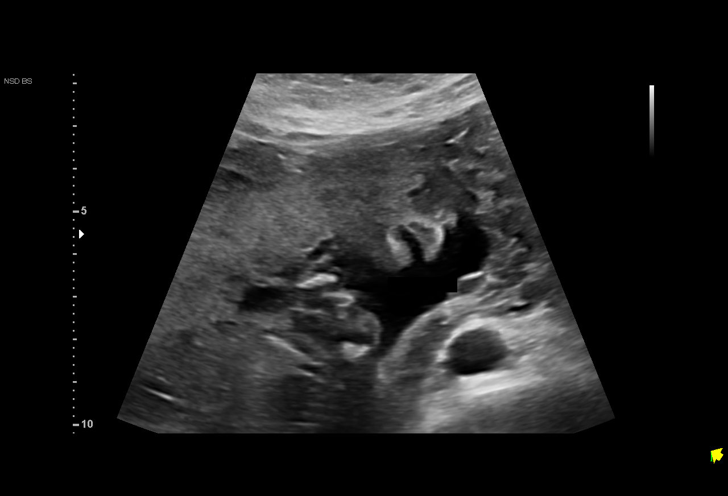
[im 82/86]
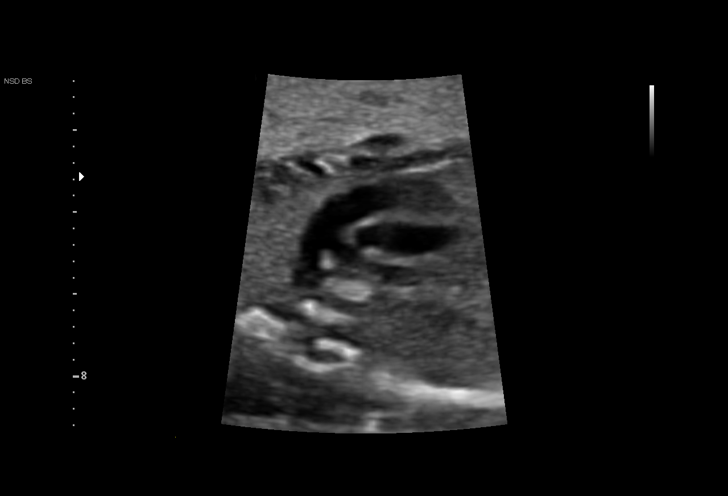

[13 of 28 positions shown; findings below may reference images not displayed]

Indications

 Advanced maternal age primigravida 35+,
 second trimester
 19 weeks gestation of pregnancy
 Encounter for antenatal screening for
 malformations
 Echogenic intracardiac focus of the heart
 (EIF Left ventricle)
 LOW risk NIPS
 Genetic carrier (CF carrier)
Fetal Evaluation

 Num Of Fetuses:         1
 Fetal Heart Rate(bpm):  147
 Cardiac Activity:       Observed
 Presentation:           Transverse, head to maternal left
 Placenta:               Anterior
 P. Cord Insertion:      Not well visualized

 Amniotic Fluid
 AFI FV:      Within normal limits

                             Largest Pocket(cm)

Biometry

 BPD:      41.9  mm     G. Age:  18w 5d         37  %    CI:        71.18   %    70 - 86
                                                         FL/HC:      17.6   %    16.1 -
 HC:      158.2  mm     G. Age:  18w 5d         28  %    HC/AC:      1.16        1.09 -
 AC:      136.9  mm     G. Age:  19w 1d         50  %    FL/BPD:     66.6   %
 FL:       27.9  mm     G. Age:  18w 4d         27  %    FL/AC:      20.4   %    20 - 24
 HUM:      27.5  mm     G. Age:  18w 6d         44  %
 CER:      18.6  mm     G. Age:  18w 2d         10  %
 NFT:       3.6  mm

 LV:        5.7  mm
 CM:        5.5  mm

 Est. FW:     260  gm      0 lb 9 oz     36  %
OB History

 Gravidity:    1         Term:   0        Prem:   0        SAB:   0
 TOP:          0       Ectopic:  0        Living: 0
Gestational Age

 LMP:           19w 0d        Date:  12/19/19                 EDD:   09/24/20
 U/S Today:     18w 6d                                        EDD:   09/25/20
 Best:          19w 0d     Det. By:  LMP  (12/19/19)          EDD:   09/24/20
Anatomy

 Cranium:               Appears normal         LVOT:                   Appears normal
 Cavum:                 Appears normal         Aortic Arch:            Appears normal
 Ventricles:            Appears normal         Ductal Arch:            Appears normal
 Choroid Plexus:        Appears normal         Diaphragm:              Appears normal
 Cerebellum:            Appears normal         Stomach:                Appears normal, left
                                                                       sided
 Posterior Fossa:       Appears normal         Abdomen:                Appears normal
 Nuchal Fold:           Appears normal         Abdominal Wall:         Appears nml (cord
                                                                       insert, abd wall)
 Face:                  Appears normal         Cord Vessels:           Appears normal (3
                        (orbits and profile)                           vessel cord)
 Lips:                  Appears normal         Kidneys:                Appear normal
 Palate:                Not well visualized    Bladder:                Appears normal
 Thoracic:              Appears normal         Spine:                  Not well visualized
 Heart:                 Appears normal; EIF    Upper Extremities:      Visualized
 RVOT:                  Appears normal         Lower Extremities:      Visualized

 Other:  Fetus appears to be female. Heels visualized. Technically difficult due
         to fetal position.
Cervix Uterus Adnexa

 Cervix
 Length:           3.17  cm.
 Normal appearance by transabdominal scan.

 Right Ovary
 Within normal limits.

 Left Ovary
 Within normal limits.

 Adnexa
 No abnormality visualized.
Myomas

 Site                     L(cm)      W(cm)      D(cm)       Location
 Anterior

 Blood Flow                  RI       PI       Comments

Impression

 G1P0. Patient is here for fetal anatomy scan.
 On cell-free fetal DNA screening, the risks of aneuploidies
 were not increased.
 Patient is a carrier of cystic fibrosis mutation.  She had
 genetic counseling and partner is being screened for carrier
 status.  The results are pending.
 We performed fetal anatomy scan. An echogenic intracardiac
 focus is seen. No other makers of aneuploidies or fetal
 structural defects are seen. Fetal biometry is consistent with
 her previously-established dates. Amniotic fluid is normal and
 good fetal activity is seen.
 I informed the patient that given that she had Naung Rifky for fetal
 aneuploidies on cell-free fetal DNA screening, finding of
 echogenic intracardiac focus should be considered a normal
 variant and that the risk of trisomy 21 is not increased. I also
 reassured that echogenic focus does not increase the risk of
 cardiac defects. I also informed her that only amniocentesis
 will give a defintive result on the fetal karyotype.
 Patient opted not to have amniocentesis.
Recommendations

 -An appointment was made for her to return in 4 weeks for
 completion of fetal anatomy (fetal spine).
                 Tiger, Bino

## 2021-06-25 ENCOUNTER — Ambulatory Visit: Payer: 59 | Admitting: Podiatry

## 2021-07-02 ENCOUNTER — Ambulatory Visit: Payer: 59 | Admitting: Podiatry

## 2021-07-09 ENCOUNTER — Encounter: Payer: Self-pay | Admitting: Podiatry

## 2021-07-09 ENCOUNTER — Ambulatory Visit (INDEPENDENT_AMBULATORY_CARE_PROVIDER_SITE_OTHER): Payer: 59 | Admitting: Podiatry

## 2021-07-09 DIAGNOSIS — M76821 Posterior tibial tendinitis, right leg: Secondary | ICD-10-CM

## 2021-07-09 NOTE — Progress Notes (Signed)
  Subjective:  Patient ID: Elizabeth Perkins, female    DOB: [birth year],   MRN: [id number]  Chief Complaint  Patient presents with   Plantar Fasciitis    R PTTD     36 y.o. female presents for follow-up of right PTTD. Relates she is doing about 80% better. Has been wearing brace and doing the stretching. Relates left foot was starting to act up but is doing ok.   Did break fourth toe when she was little. Denies any other pedal complaints. Denies n/v/f/c.   Past Medical History:  Diagnosis Date   Abnormal Pap smear of cervix    HGSIL 2019 Never followed up   HPV in female    Plantar fasciitis, right    Vaginal Pap smear, abnormal    ASCUS   Vitamin D deficiency     Objective:  Physical Exam: Vascular: DP/PT pulses 2/4 bilateral. CFT <3 seconds. Normal hair growth on digits. No edema.  Skin. No lacerations or abrasions bilateral feet.  Musculoskeletal: MMT 5/5 bilateral lower extremities in DF, PF, Inversion and Eversion. Deceased ROM in DF of ankle joint. Mildly tender to insertion of PT tendon and proximally along tendon. Pain with inversion. Some pain at medial calcaneal tubercle.  Neurological: Sensation intact to light touch.   Assessment:   1. Posterior tibial tendon dysfunction (PTTD) of right lower extremity      Plan:  Patient was evaluated and treated and all questions answered. X-rays reviewed and discussed with patient. No acute fractures or dislocations noted.  Discussed PTTD diagnosis and treatment options with patient.. Continue stretching and bracing.  Continue anti-inflammatories as needed.  Work note provided to be off feet when need rest.  Discussed if there is no improvement PT/MRI/injection may be an option. Patient to return to clinic as needed.     Elizabeth Perkins, DPM

## 2021-07-16 ENCOUNTER — Ambulatory Visit: Payer: 59 | Admitting: Podiatry

## 2021-11-11 DIAGNOSIS — R87613 High grade squamous intraepithelial lesion on cytologic smear of cervix (HGSIL): Secondary | ICD-10-CM | POA: Diagnosis not present

## 2021-11-11 DIAGNOSIS — N72 Inflammatory disease of cervix uteri: Secondary | ICD-10-CM | POA: Diagnosis not present

## 2021-11-11 DIAGNOSIS — Z124 Encounter for screening for malignant neoplasm of cervix: Secondary | ICD-10-CM | POA: Diagnosis not present

## 2021-11-11 DIAGNOSIS — Z01818 Encounter for other preprocedural examination: Secondary | ICD-10-CM | POA: Diagnosis not present

## 2021-11-11 DIAGNOSIS — Z1151 Encounter for screening for human papillomavirus (HPV): Secondary | ICD-10-CM | POA: Diagnosis not present

## 2021-12-24 DIAGNOSIS — E010 Iodine-deficiency related diffuse (endemic) goiter: Secondary | ICD-10-CM | POA: Diagnosis not present

## 2021-12-24 DIAGNOSIS — Z0000 Encounter for general adult medical examination without abnormal findings: Secondary | ICD-10-CM | POA: Diagnosis not present

## 2021-12-24 DIAGNOSIS — E782 Mixed hyperlipidemia: Secondary | ICD-10-CM | POA: Diagnosis not present

## 2021-12-24 DIAGNOSIS — G43019 Migraine without aura, intractable, without status migrainosus: Secondary | ICD-10-CM | POA: Diagnosis not present

## 2021-12-24 DIAGNOSIS — E559 Vitamin D deficiency, unspecified: Secondary | ICD-10-CM | POA: Diagnosis not present

## 2021-12-24 DIAGNOSIS — Z131 Encounter for screening for diabetes mellitus: Secondary | ICD-10-CM | POA: Diagnosis not present

## 2022-01-06 DIAGNOSIS — E010 Iodine-deficiency related diffuse (endemic) goiter: Secondary | ICD-10-CM | POA: Diagnosis not present

## 2022-04-25 ENCOUNTER — Ambulatory Visit
Admission: EM | Admit: 2022-04-25 | Discharge: 2022-04-25 | Disposition: A | Discharge status: Home / Self Care | Payer: Commercial Managed Care - PPO | Attending: Urgent Care | Admitting: Urgent Care

## 2022-04-25 ENCOUNTER — Other Ambulatory Visit: Payer: Self-pay

## 2022-04-25 ENCOUNTER — Encounter: Payer: Self-pay | Admitting: Emergency Medicine

## 2022-04-25 DIAGNOSIS — K0889 Other specified disorders of teeth and supporting structures: Secondary | ICD-10-CM

## 2022-04-25 DIAGNOSIS — K047 Periapical abscess without sinus: Secondary | ICD-10-CM

## 2022-04-25 MED ORDER — AMOXICILLIN 500 MG PO CAPS: 500.0000 mg | ORAL_CAPSULE | Freq: Three times a day (TID) | ORAL | 0 refills | Status: AC

## 2022-04-25 MED ORDER — HYDROCODONE-ACETAMINOPHEN 5-300 MG PO TABS: 0.5000 | ORAL_TABLET | Freq: Three times a day (TID) | ORAL | 0 refills | Status: DC | PRN

## 2022-04-25 NOTE — ED Triage Notes (Addendum)
Patient presents to Urgent Care with complaints of toothache since 1 day ago. Patient reports having fillings on the right side. Having pain with eating. Has gotten worst and over night having constant pain. Has been looking for Urgent dental but not open until Monday. Has taken Tylenol but it upsets her stomach. She is [redacted] weeks pregnant. Has been using ice to the area. Marland Kitchen

## 2022-04-25 NOTE — ED Provider Notes (Signed)
[facility]    CSN: [id number] Arrival date & time: 04/25/22  1219      History   Chief Complaint Chief Complaint  Patient presents with   Dental Pain    HPI Elizabeth Perkins is a 37 y.o. female.   37yo female who is currently [redacted] weeks pregnant presents today for severe 10/10 pain to her second to back molar on the top R. She reports two weeks ago having a new crown placed to the backmost molar. Several days after that procedure, the tooth in front of it started causing her slight discomfort. She states for the past day however, she has been in exquisite pain. Has been taking tylenol without relief. Denies fever or chills. No swelling or face or abscess formation. States the pain is shooting a lancinating pain up into her right sinus and R ear.    Dental Pain   Past Medical History:  Diagnosis Date   Abnormal Pap smear of cervix    HGSIL 2019 Never followed up   HPV in female    Plantar fasciitis, right    Vaginal Pap smear, abnormal    ASCUS   Vitamin D deficiency     Patient Active Problem List   Diagnosis Date Noted   Cesarean delivery delivered 10/05/2020   Cystic fibrosis gene carrier 04/14/2020   HGSIL (high grade squamous intraepithelial lesion) on Pap smear of cervix 12/15/2016    Past Surgical History:  Procedure Laterality Date   CESAREAN SECTION N/A 10/03/2020   Procedure: CESAREAN SECTION;  Surgeon: Griffin Basil, MD;  Location: [facility];  Service: Obstetrics;  Laterality: N/A;   COLPOSCOPY      OB History     Gravida  2   Para  1   Term  1   Preterm      AB      Living  1      SAB      IAB      Ectopic      Multiple  0   Live Births  1            Home Medications    Prior to Admission medications   Medication Sig Start Date End Date Taking? Authorizing Provider  amoxicillin (AMOXIL) 500 MG capsule Take 1 capsule (500 mg total) by mouth 3 (three) times daily for 10 days. 04/25/22 05/05/22 Yes Herve Haug  L, PA  HYDROcodone-Acetaminophen 5-300 MG TABS Take 0.5 tablets by mouth every 8 (eight) hours as needed. 04/25/22  Yes Samantha Olivera L, PA  Multiple Vitamin (MULTIVITAMIN) tablet Take 1 tablet by mouth daily.   Yes [provider]    Family History Family History  Problem Relation Age of Onset   Healthy Mother    Healthy Father     Social History Social History   Tobacco Use   Smoking status: Never   Smokeless tobacco: Never  Vaping Use   Vaping Use: Never used  Substance Use Topics   Alcohol use: Yes    Comment: rare   Drug use: Never     Allergies   Patient has no known allergies.   Review of Systems Review of Systems As per HPI  Physical Exam Triage Vital Signs ED Triage Vitals  Enc Vitals Group     BP 04/25/22 1320 114/74     Pulse Rate 04/25/22 1320 63     Resp 04/25/22 1320 16     Temp 04/25/22 1320 98.5 F (  36.9 C)     Temp Source 04/25/22 1320 Oral     SpO2 04/25/22 1320 100 %     Weight --      Height --      Head Circumference --      Peak Flow --      Pain Score 04/25/22 1318 10     Pain Loc --      Pain Edu? --      Excl. in [place]? --    No data found.  Updated Vital Signs BP 114/74 (BP Location: Right Arm)   Pulse 63   Temp 98.5 F (36.9 C) (Oral)   Resp 16   SpO2 100%   Visual Acuity Right Eye Distance:   Left Eye Distance:   Bilateral Distance:    Right Eye Near:   Left Eye Near:    Bilateral Near:     Physical Exam Vitals and nursing note reviewed.  Constitutional:      General: She is not in acute distress.    Appearance: Normal appearance. She is not ill-appearing, toxic-appearing or diaphoretic.  HENT:     Head: Normocephalic and atraumatic.     Right Ear: External ear normal.     Left Ear: External ear normal.     Mouth/Throat:     Lips: Pink.     Mouth: Mucous membranes are moist. No oral lesions or angioedema.     Dentition: Abnormal dentition. Dental tenderness, gingival swelling and dental caries  present. No dental abscesses.     Tongue: No lesions.     Palate: No mass and lesions.     Pharynx: Oropharynx is clear. Uvula midline. No pharyngeal swelling, oropharyngeal exudate, posterior oropharyngeal erythema or uvula swelling.   Cardiovascular:     Rate and Rhythm: Normal rate.  Pulmonary:     Effort: Pulmonary effort is normal. No respiratory distress.  Musculoskeletal:     Cervical back: Normal range of motion and neck supple. No rigidity or tenderness.  Lymphadenopathy:     Cervical: No cervical adenopathy.  Skin:    General: Skin is warm and dry.     Findings: No erythema or rash.  Neurological:     General: No focal deficit present.     Mental Status: She is alert and oriented to person, place, and time.      UC Treatments / Results  Labs (all labs ordered are listed, but only abnormal results are displayed) Labs Reviewed - No data to display  EKG   Radiology No results found.  Procedures Procedures (including critical care time)  Medications Ordered in UC Medications - No data to display  Initial Impression / Assessment and Plan / UC Course  I have reviewed the triage vital signs and the nursing notes.  Pertinent labs & imaging results that were available during my care of the patient were reviewed by me and considered in my medical decision making (see chart for details).     Dental infection -suspect infection under patient's crown on the second back molar on the right top jaw.  Patient will be going to dental this following week.  Patient is currently pregnant therefore we discussed only pregnancy safe treatment options including amoxicillin and hydrocodone.  Patient aware that opiates cross the placenta, but short courses and low doses are generally deemed safe. Pt to ONLY take for severe pain, maximize tylenol use first. Do not exceed 3g tylenol from all sources.  Tooth pain - as above.  Final Clinical Impressions(s) / UC Diagnoses   Final  diagnoses:  Dental infection  Tooth pain     Discharge Instructions      Please follow-up with dental services as soon as possible. Start taking the amoxicillin 3 times daily. Please eat a yogurt daily to help prevent any vaginal yeast infection from the antibiotics.  Please use up to 1000 mg Tylenol to help with any acute pain. If your pain becomes more severe, you may take 1/2 tablet of hydrocodone.  This medication however also contains Tylenol. You cannot exceed 3 g, or 3000 mg, from all Tylenol sources in 24 hours. Take with caution as it may make you drowsy.     ED Prescriptions     Medication Sig Dispense Auth. Provider   HYDROcodone-Acetaminophen 5-300 MG TABS Take 0.5 tablets by mouth every 8 (eight) hours as needed. 5 tablet Marvia Troost L, PA   amoxicillin (AMOXIL) 500 MG capsule Take 1 capsule (500 mg total) by mouth 3 (three) times daily for 10 days. 30 capsule Josedejesus Marcum L, [place]      I have reviewed the PDMP during this encounter.   Chaney Malling, [place] 04/25/22 1414

## 2022-04-25 NOTE — Discharge Instructions (Signed)
Please follow-up with dental services as soon as possible. Start taking the amoxicillin 3 times daily. Please eat a yogurt daily to help prevent any vaginal yeast infection from the antibiotics.  Please use up to 1000 mg Tylenol to help with any acute pain. If your pain becomes more severe, you may take 1/2 tablet of hydrocodone.  This medication however also contains Tylenol. You cannot exceed 3 g, or 3000 mg, from all Tylenol sources in 24 hours. Take with caution as it may make you drowsy.

## 2022-04-26 ENCOUNTER — Telehealth: Payer: Self-pay | Admitting: Emergency Medicine

## 2022-04-26 ENCOUNTER — Encounter: Payer: Self-pay | Admitting: *Deleted

## 2022-04-26 NOTE — Telephone Encounter (Signed)
Spoke with patient, still having pain, but has a follow up with her dentist today at 35.  Patient will follow up as needed.

## 2022-05-27 ENCOUNTER — Encounter: Payer: Self-pay | Admitting: Podiatry

## 2022-05-27 ENCOUNTER — Ambulatory Visit (INDEPENDENT_AMBULATORY_CARE_PROVIDER_SITE_OTHER): Payer: Commercial Managed Care - PPO | Admitting: Obstetrics & Gynecology

## 2022-05-27 ENCOUNTER — Encounter: Payer: Self-pay | Admitting: Obstetrics & Gynecology

## 2022-05-27 ENCOUNTER — Ambulatory Visit: Payer: Commercial Managed Care - PPO | Admitting: Podiatry

## 2022-05-27 ENCOUNTER — Other Ambulatory Visit (INDEPENDENT_AMBULATORY_CARE_PROVIDER_SITE_OTHER): Payer: Commercial Managed Care - PPO

## 2022-05-27 ENCOUNTER — Ambulatory Visit (INDEPENDENT_AMBULATORY_CARE_PROVIDER_SITE_OTHER): Payer: Commercial Managed Care - PPO

## 2022-05-27 VITALS — BP 119/67 | HR 81 | Wt 185.0 lb

## 2022-05-27 DIAGNOSIS — M76822 Posterior tibial tendinitis, left leg: Secondary | ICD-10-CM | POA: Diagnosis not present

## 2022-05-27 DIAGNOSIS — M76821 Posterior tibial tendinitis, right leg: Secondary | ICD-10-CM

## 2022-05-27 DIAGNOSIS — M722 Plantar fascial fibromatosis: Secondary | ICD-10-CM

## 2022-05-27 DIAGNOSIS — Z3480 Encounter for supervision of other normal pregnancy, unspecified trimester: Secondary | ICD-10-CM

## 2022-05-27 DIAGNOSIS — M79672 Pain in left foot: Secondary | ICD-10-CM | POA: Diagnosis not present

## 2022-05-27 DIAGNOSIS — Z3A01 Less than 8 weeks gestation of pregnancy: Secondary | ICD-10-CM | POA: Diagnosis not present

## 2022-05-27 DIAGNOSIS — Z3481 Encounter for supervision of other normal pregnancy, first trimester: Secondary | ICD-10-CM

## 2022-05-27 DIAGNOSIS — Z3490 Encounter for supervision of normal pregnancy, unspecified, unspecified trimester: Secondary | ICD-10-CM

## 2022-05-27 NOTE — Progress Notes (Signed)
Called to bedside ultrasound.  Gestational sac and yolk sac seen however no fetal pole.  Discussed with patient and family concern for early pregnancy versus possible miscarriage.  Plan for repeat ultrasound in 10 to 14 days.  Reviewed miscarriage precautions.  Questions and concerns were addressed patient agreeable to above.  Myna Hidalgo, DO Attending Obstetrician & Gynecologist, [facility] for [employer], [facility]

## 2022-05-27 NOTE — Progress Notes (Signed)
  Subjective:  Patient ID: Elizabeth Perkins, female    DOB: 12-27-1985,   MRN: [id number]  Chief Complaint  Patient presents with   Foot Pain    Right foot heel pain     37 y.o. female presents for follow-up of right PTTD. Relates she has continued to have pain on an off and wearing brace and stretching. Relates left foot is starting to act up more now. She is now [redacted] weeks pregnant and concerned for pain worsening as pregnancy progresses.  Denies any other pedal complaints. Denies n/v/f/c.   Past Medical History:  Diagnosis Date   Abnormal Pap smear of cervix    HGSIL 2019 Never followed up   HPV in female    Plantar fasciitis, right    Vaginal Pap smear, abnormal    ASCUS   Vitamin D deficiency     Objective:  Physical Exam: Vascular: DP/PT pulses 2/4 bilateral. CFT <3 seconds. Normal hair growth on digits. No edema.  Skin. No lacerations or abrasions bilateral feet.  Musculoskeletal: MMT 5/5 bilateral lower extremities in DF, PF, Inversion and Eversion. Deceased ROM in DF of ankle joint. Mildly tender to insertion of PT tendon and proximally along tendon. Pain with inversion. Some pain at medial calcaneal tubercle. Pain on the left as well in PT insertion and calcaneal tuberosity but not as significant.  Neurological: Sensation intact to light touch.   Assessment:   1. Left foot pain   2. Posterior tibial tendon dysfunction (PTTD) of right lower extremity   3. Posterior tibial tendon dysfunction (PTTD) of left lower extremity   4. Bilateral plantar fasciitis      Plan:  Patient was evaluated and treated and all questions answered. X-rays reviewed and discussed with patient. No acute fractures or dislocations noted.  Discussed PTTD diagnosis and treatment options with patient.. Continue stretching and bracing.  Referral to PT sent.  Additional trilock brace for left side provided.  Will hold off on anti-inflammatories aside from tyelnol as [redacted] weeks pregnant.  Work note  provided to be off feet when need rest.  Discussed if there is no improvement PT/MRI/injection may be an option. Patient to return to clinic as needed.     Louann Sjogren, DPM

## 2022-05-30 ENCOUNTER — Inpatient Hospital Stay (HOSPITAL_COMMUNITY)
Admission: AD | Admit: 2022-05-30 | Discharge: 2022-05-30 | Disposition: A | Discharge status: Home / Self Care | Payer: Commercial Managed Care - PPO | Attending: Obstetrics & Gynecology | Admitting: Obstetrics & Gynecology

## 2022-05-30 ENCOUNTER — Inpatient Hospital Stay (HOSPITAL_COMMUNITY): Payer: Commercial Managed Care - PPO

## 2022-05-30 ENCOUNTER — Other Ambulatory Visit: Payer: Self-pay

## 2022-05-30 DIAGNOSIS — Z3A11 11 weeks gestation of pregnancy: Secondary | ICD-10-CM

## 2022-05-30 DIAGNOSIS — O209 Hemorrhage in early pregnancy, unspecified: Secondary | ICD-10-CM | POA: Diagnosis not present

## 2022-05-30 DIAGNOSIS — Z6790 Unspecified blood type, Rh positive: Secondary | ICD-10-CM

## 2022-05-30 DIAGNOSIS — O3680X0 Pregnancy with inconclusive fetal viability, not applicable or unspecified: Secondary | ICD-10-CM | POA: Insufficient documentation

## 2022-05-30 DIAGNOSIS — Z3A01 Less than 8 weeks gestation of pregnancy: Secondary | ICD-10-CM | POA: Diagnosis not present

## 2022-05-30 LAB — URINALYSIS, ROUTINE W REFLEX MICROSCOPIC
Bilirubin Urine: NEGATIVE
Glucose, UA: NEGATIVE mg/dL
Ketones, ur: NEGATIVE mg/dL
Leukocytes,Ua: NEGATIVE
Nitrite: NEGATIVE
Protein, ur: NEGATIVE mg/dL
Specific Gravity, Urine: 1.011 (ref 1.005–1.030)
pH: 7 (ref 5.0–8.0)

## 2022-05-30 LAB — CBC
HCT: 36.3 % (ref 36.0–46.0)
Hemoglobin: 12.3 g/dL (ref 12.0–15.0)
MCH: 27.2 pg (ref 26.0–34.0)
MCHC: 33.9 g/dL (ref 30.0–36.0)
MCV: 80.3 fL (ref 80.0–100.0)
Platelets: 299 10*3/uL (ref 150–400)
RBC: 4.52 MIL/uL (ref 3.87–5.11)
RDW: 13.7 % (ref 11.5–15.5)
WBC: 9.9 10*3/uL (ref 4.0–10.5)
nRBC: 0 % (ref 0.0–0.2)

## 2022-05-30 LAB — WET PREP, GENITAL
Clue Cells Wet Prep HPF POC: NONE SEEN
Sperm: NONE SEEN
Trich, Wet Prep: NONE SEEN
WBC, Wet Prep HPF POC: >=10 — AB (ref <10)
Yeast Wet Prep HPF POC: NONE SEEN

## 2022-05-30 LAB — HCG, QUANTITATIVE, PREGNANCY: hCG, Beta Chain, Quant, S: 11682 m[IU]/mL — ABNORMAL HIGH (ref <5)

## 2022-05-30 NOTE — MAU Note (Signed)
.  Elizabeth Perkins is a 37 y.o. at [redacted]w[redacted]d here in MAU reporting: lower abd cramping and spotting that started around 1300 today.  Reports she was told she may be experiencing a miscarriage at her last visit.   Onset of complaint: 1300 Pain score: 7 Vitals:   05/30/22 1439  BP: 129/77  Pulse: 91  Resp: 17  Temp: 98.4 F (36.9 C)  SpO2: 100%   Lab orders placed from triage:   ua

## 2022-05-30 NOTE — MAU Provider Note (Signed)
History     CSN: [id number]  Arrival date and time: 05/30/22 1414   Event Date/Time   First Provider Initiated Contact with Patient 05/30/22 1827      Chief Complaint  Patient presents with   Abdominal Pain   Vaginal Bleeding   37 y.o. G2P1001  by LMP presenting with abdominal cramping and spotting. Reports sx earlier today while she was working. Rates pain 7/10. Has not treated it. Had [place] in office 3 days ago that was suspicious for miscarriage. Currently having minimal pain and no bleeding.     OB History     Gravida  2   Para  1   Term  1   Preterm      AB      Living  1      SAB      IAB      Ectopic      Multiple  0   Live Births  1           Past Medical History:  Diagnosis Date   Abnormal Pap smear of cervix    HGSIL 2019 Never followed up   HPV in female    Plantar fasciitis, right    Vaginal Pap smear, abnormal    ASCUS   Vitamin D deficiency     Past Surgical History:  Procedure Laterality Date   CESAREAN SECTION N/A 10/03/2020   Procedure: CESAREAN SECTION;  Surgeon: Warden Fillers, MD;  Location: [facility] LD [facility];  Service: Obstetrics;  Laterality: N/A;   COLPOSCOPY      Family History  Problem Relation Age of Onset   Healthy Mother    Healthy Father     Social History   Tobacco Use   Smoking status: Never   Smokeless tobacco: Never  Vaping Use   Vaping Use: Never used  Substance Use Topics   Alcohol use: Yes    Comment: rare   Drug use: Never    Allergies: No Known Allergies  No medications prior to admission.    Review of Systems  Gastrointestinal:  Positive for abdominal pain.  Genitourinary:  Positive for vaginal bleeding.   Physical Exam   Blood pressure 129/77, pulse 91, temperature 98.4 F (36.9 C), temperature source Oral, resp. rate 17, last menstrual period 03/10/2022, SpO2 100 %, not currently breastfeeding.  Physical Exam Vitals and nursing note reviewed.  Constitutional:      General: She  is not in acute distress (appears comfortable).    Appearance: Normal appearance.  HENT:     Head: Normocephalic and atraumatic.  Cardiovascular:     Rate and Rhythm: Normal rate.  Pulmonary:     Effort: Pulmonary effort is normal. No respiratory distress.  Musculoskeletal:        General: Normal range of motion.     Cervical back: Normal range of motion.  Neurological:     General: No focal deficit present.     Mental Status: She is alert and oriented to person, place, and time.  Psychiatric:        Mood and Affect: Mood normal.    Results for orders placed or performed during the hospital encounter of 05/30/22 (from the past 24 hour(s))  CBC     Status: None   Collection Time: 05/30/22  3:09 PM  Result Value Ref Range   WBC 9.9 4.0 - 10.5 K/uL   RBC 4.52 3.87 - 5.11 MIL/uL   Hemoglobin 12.3 12.0 - 15.0 g/dL  HCT 36.3 36.0 - 46.0 %   MCV 80.3 80.0 - 100.0 fL   MCH 27.2 26.0 - 34.0 pg   MCHC 33.9 30.0 - 36.0 g/dL   RDW 40.9 81.1 - 91.4 %   Platelets 299 150 - 400 K/uL   nRBC 0.0 0.0 - 0.2 %  hCG, quantitative, pregnancy     Status: Abnormal   Collection Time: 05/30/22  3:09 PM  Result Value Ref Range   hCG, Beta Chain, Quant, S 11,682 (H) <5 mIU/mL  Urinalysis, Routine w reflex microscopic -Urine, Clean Catch     Status: Abnormal   Collection Time: 05/30/22  5:14 PM  Result Value Ref Range   Color, Urine YELLOW YELLOW   APPearance CLOUDY (A) CLEAR   Specific Gravity, Urine 1.011 1.005 - 1.030   pH 7.0 5.0 - 8.0   Glucose, UA NEGATIVE NEGATIVE mg/dL   Hgb urine dipstick SMALL (A) NEGATIVE   Bilirubin Urine NEGATIVE NEGATIVE   Ketones, ur NEGATIVE NEGATIVE mg/dL   Protein, ur NEGATIVE NEGATIVE mg/dL   Nitrite NEGATIVE NEGATIVE   Leukocytes,Ua NEGATIVE NEGATIVE   RBC / HPF 0-5 0 - 5 RBC/hpf   WBC, UA 0-5 0 - 5 WBC/hpf   Bacteria, UA RARE (A) NONE SEEN   Squamous Epithelial / HPF 0-5 0 - 5 /HPF   Amorphous Crystal PRESENT   Wet prep, genital     Status: Abnormal    Collection Time: 05/30/22  5:37 PM  Result Value Ref Range   Yeast Wet Prep HPF POC NONE SEEN NONE SEEN   Trich, Wet Prep NONE SEEN NONE SEEN   Clue Cells Wet Prep HPF POC NONE SEEN NONE SEEN   WBC, Wet Prep HPF POC >=10 (A) <10   Sperm NONE SEEN    US OB LESS THAN 14 WEEKS WITH OB TRANSVAGINAL  Result Date: 05/30/2022 CLINICAL DATA:  Vaginal bleeding and pelvic cramping in 1st trimester pregnancy. EXAM: OBSTETRIC <14 WK [place] AND TRANSVAGINAL OB US TECHNIQUE: Both transabdominal and transvaginal ultrasound examinations were performed for complete evaluation of the gestation as well as the maternal uterus, adnexal regions, and pelvic cul-de-sac. Transvaginal technique was performed to assess early pregnancy. COMPARISON:  None Available. FINDINGS: Intrauterine gestational sac: Single, with irregular sac shape noted Yolk sac:  Not Visualized. Embryo:  Not Visualized. MSD: 23 mm   7 w   1 d Subchorionic hemorrhage:  None visualized. Maternal uterus/adnexae: Both ovaries are normal in appearance. No adnexal mass or abnormal free fluid identified. IMPRESSION: Findings are suspicious but not yet definitive for failed pregnancy. Recommend follow-up US in 7 days for definitive diagnosis. This recommendation follows SRU consensus guidelines: Diagnostic Criteria for Nonviable Pregnancy Early in the First Trimester. Malva Limes Med 2013; 782:9562-13. Electronically Signed   By: Danae Orleans M.D.   On: 05/30/2022 18:17    MAU Course  Procedures  MDM Chart reviewed: previous US showed IUGS and YS, no FP. [place] today is suspicious for failed pregnancy with irregular IUGS measuring 7 wks, no qhcg for comparison. Discussed findings with pt. Offered f/u this upcoming week or she can wait for [place] next week, she prefers to wait. Discussed return precautions. Stable for discharge home.   Assessment and Plan   1. Pregnancy with inconclusive fetal viability, single or unspecified fetus    Discharge home Follow up at [facility] as  scheduled Return precautions  Allergies as of 05/30/2022   No Known Allergies      Medication List  You have not been prescribed any medications.     Donette Larry, CNM 05/30/2022, 6:34 PM

## 2022-06-01 LAB — GC/CHLAMYDIA PROBE AMP (~~LOC~~) NOT AT ARMC
Chlamydia: NEGATIVE
Comment: NEGATIVE
Comment: NORMAL
Neisseria Gonorrhea: NEGATIVE

## 2022-06-02 DIAGNOSIS — O99210 Obesity complicating pregnancy, unspecified trimester: Secondary | ICD-10-CM | POA: Insufficient documentation

## 2022-06-02 DIAGNOSIS — O3680X0 Pregnancy with inconclusive fetal viability, not applicable or unspecified: Secondary | ICD-10-CM | POA: Insufficient documentation

## 2022-06-03 ENCOUNTER — Ambulatory Visit: Payer: Commercial Managed Care - PPO | Admitting: Podiatry

## 2022-06-03 ENCOUNTER — Ambulatory Visit: Payer: Commercial Managed Care - PPO | Admitting: Physical Therapy

## 2022-06-05 ENCOUNTER — Inpatient Hospital Stay (HOSPITAL_COMMUNITY): Payer: Commercial Managed Care - PPO

## 2022-06-05 ENCOUNTER — Inpatient Hospital Stay (HOSPITAL_COMMUNITY)
Admission: AD | Admit: 2022-06-05 | Discharge: 2022-06-05 | Disposition: A | Discharge status: Home / Self Care | Payer: Commercial Managed Care - PPO | Attending: Obstetrics and Gynecology | Admitting: Obstetrics and Gynecology

## 2022-06-05 ENCOUNTER — Encounter (HOSPITAL_COMMUNITY): Payer: Self-pay | Admitting: Obstetrics and Gynecology

## 2022-06-05 DIAGNOSIS — Z6740 Type O blood, Rh positive: Secondary | ICD-10-CM

## 2022-06-05 DIAGNOSIS — N939 Abnormal uterine and vaginal bleeding, unspecified: Secondary | ICD-10-CM | POA: Diagnosis present

## 2022-06-05 DIAGNOSIS — Z3A12 12 weeks gestation of pregnancy: Secondary | ICD-10-CM

## 2022-06-05 DIAGNOSIS — O039 Complete or unspecified spontaneous abortion without complication: Secondary | ICD-10-CM | POA: Insufficient documentation

## 2022-06-05 DIAGNOSIS — Z3A01 Less than 8 weeks gestation of pregnancy: Secondary | ICD-10-CM | POA: Diagnosis not present

## 2022-06-05 DIAGNOSIS — O209 Hemorrhage in early pregnancy, unspecified: Secondary | ICD-10-CM | POA: Diagnosis not present

## 2022-06-05 LAB — HCG, QUANTITATIVE, PREGNANCY: hCG, Beta Chain, Quant, S: 2915 m[IU]/mL — ABNORMAL HIGH (ref <5)

## 2022-06-05 MED ORDER — OXYCODONE-ACETAMINOPHEN 5-325 MG PO TABS: 1.0000 | ORAL_TABLET | Freq: Four times a day (QID) | ORAL | 0 refills | Status: DC | PRN

## 2022-06-05 MED ORDER — MISOPROSTOL 200 MCG PO TABS
800.0000 ug | ORAL_TABLET | Freq: Once | ORAL | Status: AC
  Administered 2022-06-05: 800 ug via BUCCAL
  Filled 2022-06-05: qty 4

## 2022-06-05 MED ORDER — OXYCODONE-ACETAMINOPHEN 5-325 MG PO TABS
2.0000 | ORAL_TABLET | Freq: Once | ORAL | Status: AC
  Administered 2022-06-05: 2 via ORAL
  Filled 2022-06-05: qty 2

## 2022-06-05 MED ORDER — HYDROMORPHONE HCL 1 MG/ML IJ SOLN
1.0000 mg | Freq: Once | INTRAMUSCULAR | Status: AC
  Administered 2022-06-05: 1 mg via INTRAMUSCULAR
  Filled 2022-06-05: qty 1

## 2022-06-05 MED ORDER — KETOROLAC TROMETHAMINE 60 MG/2ML IM SOLN
60.0000 mg | Freq: Once | INTRAMUSCULAR | Status: AC
  Administered 2022-06-05: 60 mg via INTRAMUSCULAR
  Filled 2022-06-05: qty 2

## 2022-06-05 MED ORDER — IBUPROFEN 800 MG PO TABS: 800.0000 mg | ORAL_TABLET | Freq: Three times a day (TID) | ORAL | 0 refills | Status: DC | PRN

## 2022-06-05 NOTE — MAU Provider Note (Cosign Needed Addendum)
Chief Complaint:  Vaginal Bleeding and Abdominal Pain  HPI  Elizabeth Perkins is a 37 y.o. G2P1001 at [redacted]w[redacted]d who presents to maternity admissions reporting heavier bleeding with large clots and severe cramping. Cramping is a 10/10 and making her sweat, says it's "worse than labor." Was at work tonight when cramping/bleeding increased around 0300.  Was seen in MAU on 4/14 for vaginal bleeding, U/S found suspicion for nonviability but could not call it a failed pregnancy without a bHCG for comparison.  Pregnancy Course: Receives care at [facility]  Past Medical History:  Diagnosis Date   Abnormal Pap smear of cervix    HGSIL 2019 Never followed up   HGSIL (high grade squamous intraepithelial lesion) on Pap smear of cervix 12/15/2016   Formatting of this note is different from the original.  ASCUS/HPV+ on 12-02-16  02/2017: CIN 1  Needs cotesting in 11/2017   HPV in female    Plantar fasciitis, right    Vaginal Pap smear, abnormal    ASCUS   Vitamin D deficiency    OB History  Gravida Para Term Preterm AB Living  SAB IAB Ectopic Multiple Live Births        0 1    # Outcome Date GA Lbr Len/2nd Weight Sex Delivery Anes PTL Lv  2 Current           1 Term 10/03/20 [redacted]w[redacted]d 14:53 / 04:30 3121 g F CS-LTranv EPI  LIV     Birth Comments: WNL   Past Surgical History:  Procedure Laterality Date   CESAREAN SECTION N/A 10/03/2020   Procedure: CESAREAN SECTION;  Surgeon: Warden Fillers, MD;  Location: [facility] LD ORS;  Service: Obstetrics;  Laterality: N/A;   COLPOSCOPY     Family History  Problem Relation Age of Onset   Healthy Mother    Healthy Father    Social History   Tobacco Use   Smoking status: Never   Smokeless tobacco: Never  Vaping Use   Vaping Use: Never used  Substance Use Topics   Alcohol use: Yes    Comment: rare   Drug use: Never   No Known Allergies No medications prior to admission.   I have reviewed patient's Past Medical Hx, Surgical Hx, Family Hx, Social  Hx, medications and allergies.   ROS  Pertinent items noted in HPI and remainder of comprehensive ROS otherwise negative.   PHYSICAL EXAM  Patient Vitals for the past 24 hrs:  BP Temp Temp src Pulse Resp SpO2 Height Weight  06/05/22 0954 (!) 121/52 -- -- 80 -- -- -- --  06/05/22 0656 (!) 100/46 98 F (36.7 C) Oral 69 20 97 % -- --  06/05/22 0526 (!) 108/45 98 F (36.7 C) Oral 89 20 -- 5' 1.5" (1.562 m) 83.9 kg   Constitutional: Well-developed, well-nourished female in no acute distress.  Cardiovascular: normal rate & rhythm, warm and well-perfused Respiratory: normal effort, no problems with respiration noted GI: Abd soft, non-tender, non-distended MS: Extremities nontender, no edema, normal ROM Neurologic: Alert and oriented x 4.  GU: no CVA tenderness Pelvic: speculum exam - copious blood in vault along with large clots, none appeared to have tissue. Cleared blood with swabs and pulled large clot from cervical os.     Labs: Results for orders placed or performed during the hospital encounter of 06/05/22 (from the past 24 hour(s))  hCG, quantitative, pregnancy     Status: Abnormal   Collection  Time: 06/05/22  5:29 AM  Result Value Ref Range   hCG, Beta Chain, Quant, S 2,915 (H) <5 mIU/mL    Imaging:  US OB Transvaginal  Result Date: 06/05/2022 CLINICAL DATA:  37 year old pregnant female with history of vaginal bleeding. EXAM: OBSTETRIC <14 WK [place] AND TRANSVAGINAL OB US TECHNIQUE: Both transabdominal and transvaginal ultrasound examinations were performed for complete evaluation of the gestation as well as the maternal uterus, adnexal regions, and pelvic cul-de-sac. Transvaginal technique was performed to assess early pregnancy. COMPARISON:  OB ultrasound 05/30/2022. FINDINGS: Intrauterine gestational sac: Single irregular-shaped gestational sac which has migrated, and currently resides in the lower uterine segment Yolk sac:  Present Embryo:  None Cardiac Activity: None Heart Rate:  N/A MSD: 17.2 mm   6 w   4 d Subchorionic hemorrhage:  None visualized. Maternal uterus/adnexae: Neither ovary could be well visualized. IMPRESSION: 1. Irregular shaped gestational sac which has migrated from the fundal region into the lower uterine segment and is smaller than the prior examination, with internal yolk sac but no internal embryo. At this time, findings meet definitive criteria for failed pregnancy. This follows SRU consensus guidelines: Diagnostic Criteria for Nonviable Pregnancy Early in the First Trimester. Macy Mis J Med [id number]. Electronically Signed   By: Trudie Reed M.D.   On: 06/05/2022 06:59    MDM & MAU COURSE  MDM:  MAU Course: Orders Placed This Encounter  Procedures   US OB Transvaginal   hCG, quantitative, pregnancy   Discharge patient   Meds ordered this encounter  Medications   HYDROmorphone (DILAUDID) injection 1 mg   ketorolac (TORADOL) injection 60 mg   misoprostol (CYTOTEC) tablet 800 mcg   oxyCODONE-acetaminophen (PERCOCET/ROXICET) 5-325 MG per tablet 2 tablet   oxyCODONE-acetaminophen (PERCOCET/ROXICET) 5-325 MG tablet    Sig: Take 1-2 tablets by mouth every 6 (six) hours as needed for severe pain.    Dispense:  15 tablet    Refill:  0    Order Specific Question:   Supervising Provider    Answer:   Reva Bores [[id number]]   ibuprofen (ADVIL) 800 MG tablet    Sig: Take 1 tablet (800 mg total) by mouth every 8 (eight) hours as needed.    Dispense:  30 tablet    Refill:  0    Order Specific Question:   Supervising Provider    Answer:   Reva Bores [[id number]]   Pt very uncomfortable on arrival to MAU, writhing in bed during waves of abdominal pain, also sweaty. Gave 1mg  of dilaudid and a fan, pt able to tolerate my speculum exam. Seemed calmer after. Lab work collected and pt sent to U/S.  HCG has fallen significantly and U/S showed irregular GS in the lower uterine segment confirming miscarriage. Discussed results with patient and reviewed  options, she prefers cytotec therapy here in MAU with pain meds for discharge. Toradol and cytotec ordered, care turned over to Thressa Sheller, CNM.  Edd Arbour, CNM, MSN, IBCLC  [phone]: Patient's pain has improved. Will DC home with pain medication.  Message sent to the clinic to schedule 2 week FU visit    ASSESSMENT   1. SAB (spontaneous abortion)   2. Type O blood, Rh positive     PLAN    DC home in stable condition  Comfort measures reviewed  Bleeding precautions RX: percocet PRN, Ibuprofen PRN  Return to MAU as needed    Follow-up Information     [facility]  [facility] Follow up.   Specialty: Obstetrics and Gynecology Contact information: [address] [place] [address] [phone]                Allergies as of 06/05/2022   No Known Allergies      Medication List     TAKE these medications    ibuprofen 800 MG tablet Commonly known as: ADVIL Take 1 tablet (800 mg total) by mouth every 8 (eight) hours as needed.   oxyCODONE-acetaminophen 5-325 MG tablet Commonly known as: PERCOCET/ROXICET Take 1-2 tablets by mouth every 6 (six) hours as needed for severe pain.        Thressa Sheller DNP, CNM  06/05/22  10:00 AM

## 2022-06-05 NOTE — MAU Note (Signed)
Pt says VB x3 days  Then at 0300- heavy VB and cramping.  Pad in triage- mod amt red blood.

## 2022-06-08 ENCOUNTER — Ambulatory Visit: Payer: Commercial Managed Care - PPO

## 2022-06-08 DIAGNOSIS — O3680X0 Pregnancy with inconclusive fetal viability, not applicable or unspecified: Secondary | ICD-10-CM

## 2022-06-08 DIAGNOSIS — O99211 Obesity complicating pregnancy, first trimester: Secondary | ICD-10-CM

## 2022-06-09 ENCOUNTER — Ambulatory Visit: Payer: Commercial Managed Care - PPO | Admitting: Physical Therapy

## 2022-06-16 ENCOUNTER — Ambulatory Visit: Payer: Commercial Managed Care - PPO | Attending: Podiatry | Admitting: Physical Therapy

## 2022-06-16 ENCOUNTER — Other Ambulatory Visit: Payer: Self-pay

## 2022-06-16 ENCOUNTER — Encounter: Payer: Self-pay | Admitting: Physical Therapy

## 2022-06-16 DIAGNOSIS — M722 Plantar fascial fibromatosis: Secondary | ICD-10-CM | POA: Diagnosis not present

## 2022-06-16 DIAGNOSIS — M76821 Posterior tibial tendinitis, right leg: Secondary | ICD-10-CM | POA: Insufficient documentation

## 2022-06-16 DIAGNOSIS — M76822 Posterior tibial tendinitis, left leg: Secondary | ICD-10-CM | POA: Diagnosis not present

## 2022-06-16 NOTE — Therapy (Signed)
OUTPATIENT PHYSICAL THERAPY LOWER EXTREMITY EVALUATION   Patient Name: Elizabeth Perkins MRN: [id number] DOB:Apr 12, 1985, 37 y.o., female Today's Date: 06/16/2022  END OF SESSION:  PT End of Session - 06/16/22 1142     Visit Number 1    Number of Visits 12    Date for PT Re-Evaluation 07/28/22    PT Start Time 1100    PT Stop Time 1140    PT Time Calculation (min) 40 min    Activity Tolerance Patient tolerated treatment well    Behavior During Therapy [facility] for tasks assessed/performed             Past Medical History:  Diagnosis Date   Abnormal Pap smear of cervix    HGSIL 2019 Never followed up   HGSIL (high grade squamous intraepithelial lesion) on Pap smear of cervix 12/15/2016   Formatting of this note is different from the original.  ASCUS/HPV+ on 12-02-16  02/2017: CIN 1  Needs cotesting in 11/2017   HPV in female    Plantar fasciitis, right    Vaginal Pap smear, abnormal    ASCUS   Vitamin D deficiency    Past Surgical History:  Procedure Laterality Date   CESAREAN SECTION N/A 10/03/2020   Procedure: CESAREAN SECTION;  Surgeon: Warden Fillers, MD;  Location: [facility] LD ORS;  Service: Obstetrics;  Laterality: N/A;   COLPOSCOPY     Patient Active Problem List   Diagnosis Date Noted   Obesity affecting pregnancy 06/02/2022   Pregnancy with inconclusive fetal viability 06/02/2022   Cesarean delivery delivered 10/05/2020   Cystic fibrosis gene carrier 04/14/2020    PCP: Juliene Pina  REFERRING PROVIDER: Louann Sjogren  REFERRING DIAG: bilat posterior tibial tendon dysfunction, bilat plantar fasciitis  THERAPY DIAG:  Posterior tibial tendon dysfunction (PTTD) of right lower extremity  Posterior tibial tendon dysfunction (PTTD) of left lower extremity  Bilateral plantar fasciitis  Rationale for Evaluation and Treatment: Rehabilitation  ONSET DATE: 05/2022  SUBJECTIVE:   SUBJECTIVE STATEMENT: Pt has been having Rt arch and medial ankle pain worsening for  the past few months, she states the pain is beginning to start on her Lt foot too. Pain increases with prolonged walking and standing. She wears a brace PRN on Lt foot, at all times when out of bed on Rt foot. Pain decreases with stretching, ice and rest  PERTINENT HISTORY: Plantar fasciitis 2022 PAIN:  Are you having pain? Yes: NPRS scale: 2/10 currently in Rt arch, up to 7-8/10 with prolonged standing and walking/10 Pain location: Rt arch Pain description: sore, tight Aggravating factors: prolonged standing and walking Relieving factors: rest, ice  PRECAUTIONS: None  WEIGHT BEARING RESTRICTIONS: No  FALLS:  Has patient fallen in last 6 months? 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: Independent  PATIENT GOALS: work with decreased pain  NEXT MD VISIT: 07/29/22  OBJECTIVE:    PALPATION: TTP Rt plantar fascia, Rt and Lt posterior tibialis tendons TC and metatarsal jt mobility WFL bilat  LOWER EXTREMITY ROM:  MMT Right eval Left eval  Hip flexion    Hip extension    Hip abduction    Hip adduction    Hip internal rotation    Hip external rotation    Knee flexion    Knee extension    Ankle dorsiflexion 4 4+  Ankle plantarflexion 4 pain 4  Ankle inversion 4+ 4  Ankle eversion 4 4+   (Blank rows = not tested)  LOWER EXTREMITY MMT:  AROM Right  eval Left eval  Hip flexion    Hip extension    Hip abduction    Hip adduction    Hip internal rotation    Hip external rotation    Knee flexion    Knee extension    Ankle dorsiflexion 4 6  Ankle plantarflexion 50 55  Ankle inversion 18 20  Ankle eversion 22 pain 32   (Blank rows = not tested)   FUNCTIONAL TESTS:  No pain with SLS bilat    TODAY'S TREATMENT:                                                                                                                              DATE: 06/16/22 See HEP    PATIENT EDUCATION:  Education details: PT POC and goals, HEP Person educated:  Patient Education method: Explanation, Demonstration, and Handouts Education comprehension: verbalized understanding and returned demonstration  HOME EXERCISE PROGRAM: Access Code: [email or web address] URL: [email or web address] Date: 06/16/2022 Prepared by: Reggy Eye  Exercises - Ankle Inversion with Resistance  - 1 x daily - 7 x weekly - 3 sets - 10 reps - Ankle Eversion with Resistance  - 1 x daily - 7 x weekly - 3 sets - 10 reps - Toe Yoga - Alternating Great Toe and Lesser Toe Extension  - 1 x daily - 7 x weekly - 1 sets - 10 reps - Seated Heel Raise  - 1 x daily - 7 x weekly - 3 sets - 10 reps - Gastroc Stretch on Wall  - 1 x daily - 7 x weekly - 1 sets - 3 reps - 20-30 sec hold - Soleus Stretch on Wall  - 1 x daily - 7 x weekly - 1 sets - 3 reps - 20-30 sec hold  ASSESSMENT:  CLINICAL IMPRESSION: Patient is a 37 y.o. female who was seen today for physical therapy evaluation and treatment for posterior tibial tendon dysfunction and plantar fasciitis bilaterally. Pt presents with decreased strength and ROM, decreased standing and walking tolerance and increased pain. Pt will benefit from skilled PT to address deficits and improve functional activity tolerance with decreased pain.   OBJECTIVE IMPAIRMENTS: decreased activity tolerance, difficulty walking, decreased ROM, decreased strength, and pain.   ACTIVITY LIMITATIONS: standing and locomotion level  PARTICIPATION LIMITATIONS: community activity and occupation  PERSONAL FACTORS: Profession are also affecting patient's functional outcome.   REHAB POTENTIAL: Good  CLINICAL DECISION MAKING: Evolving/moderate complexity  EVALUATION COMPLEXITY: Moderate   GOALS: Goals reviewed with patient? Yes  SHORT TERM GOALS: Target date: 06/30/2022   Pt will be independent with initial HEP Baseline: Goal status: INITIAL    LONG TERM GOALS: Target date: 07/28/2022    Pt will be independent with advanced HEP Baseline:   Goal status: INITIAL  2.  Pt will improve bilat ankle strength to 5/5 to be able work with decreased pain Baseline:  Goal status: INITIAL  3.  Pt will tolerate working 12 hour shift  with foot/ankle pain <= 2/10 Baseline:  Goal status: INITIAL    PLAN:  PT FREQUENCY: 1-2x/week  PT DURATION: 6 weeks  PLANNED INTERVENTIONS: Therapeutic exercises, Therapeutic activity, Neuromuscular re-education, Balance training, Gait training, Patient/Family education, Self Care, Joint mobilization, Dry Needling, Electrical stimulation, Cryotherapy, Moist heat, Taping, Vasopneumatic device, Ultrasound, Ionotophoresis 4mg /ml Dexamethasone, Manual therapy, and Re-evaluation  PLAN FOR NEXT SESSION: assess response to HEP, foot and ankle strength, balance/coordination   Ossie Yebra, PT 06/16/2022, 11:43 AM

## 2022-06-23 ENCOUNTER — Encounter: Payer: Self-pay | Admitting: Physical Therapy

## 2022-06-23 ENCOUNTER — Ambulatory Visit: Payer: Commercial Managed Care - PPO | Admitting: Physical Therapy

## 2022-06-23 ENCOUNTER — Encounter: Payer: Self-pay | Admitting: Podiatry

## 2022-06-23 DIAGNOSIS — M76822 Posterior tibial tendinitis, left leg: Secondary | ICD-10-CM

## 2022-06-23 DIAGNOSIS — M722 Plantar fascial fibromatosis: Secondary | ICD-10-CM | POA: Diagnosis not present

## 2022-06-23 DIAGNOSIS — M76821 Posterior tibial tendinitis, right leg: Secondary | ICD-10-CM | POA: Diagnosis not present

## 2022-06-23 NOTE — Therapy (Signed)
OUTPATIENT PHYSICAL THERAPY LOWER EXTREMITY EVALUATION   Patient Name: Elizabeth Perkins MRN: [id number] DOB:Jul 13, 1985, 37 y.o., female Today's Date: 06/23/2022  END OF SESSION:  PT End of Session - 06/23/22 1218     Visit Number 2    Number of Visits 12    Date for PT Re-Evaluation 07/28/22    PT Start Time 1133    PT Stop Time 1219    PT Time Calculation (min) 46 min    Activity Tolerance Patient tolerated treatment well    Behavior During Therapy [facility] for tasks assessed/performed              Past Medical History:  Diagnosis Date   Abnormal Pap smear of cervix    HGSIL 2019 Never followed up   HGSIL (high grade squamous intraepithelial lesion) on Pap smear of cervix 12/15/2016   Formatting of this note is different from the original.  ASCUS/HPV+ on 12-02-16  02/2017: CIN 1  Needs cotesting in 11/2017   HPV in female    Plantar fasciitis, right    Vaginal Pap smear, abnormal    ASCUS   Vitamin D deficiency    Past Surgical History:  Procedure Laterality Date   CESAREAN SECTION N/A 10/03/2020   Procedure: CESAREAN SECTION;  Surgeon: Warden Fillers, MD;  Location: [facility] LD ORS;  Service: Obstetrics;  Laterality: N/A;   COLPOSCOPY     Patient Active Problem List   Diagnosis Date Noted   Obesity affecting pregnancy 06/02/2022   Pregnancy with inconclusive fetal viability 06/02/2022   Cesarean delivery delivered 10/05/2020   Cystic fibrosis gene carrier 04/14/2020    PCP: Juliene Pina  REFERRING PROVIDER: Louann Sjogren  REFERRING DIAG: bilat posterior tibial tendon dysfunction, bilat plantar fasciitis  THERAPY DIAG:  Posterior tibial tendon dysfunction (PTTD) of right lower extremity  Posterior tibial tendon dysfunction (PTTD) of left lower extremity  Bilateral plantar fasciitis  Rationale for Evaluation and Treatment: Rehabilitation  ONSET DATE: 05/2022  SUBJECTIVE:   SUBJECTIVE STATEMENT: Pt states she is sore because she just finished a 5 day work  streak. She has been trying to sit a little as she is able during work. Most of the pain is in her heels now.  PERTINENT HISTORY: Plantar fasciitis 2022 Pt has been having Rt arch and medial ankle pain worsening for the past few months, she states the pain is beginning to start on her Lt foot too. Pain increases with prolonged walking and standing. She wears a brace PRN on Lt foot, at all times when out of bed on Rt foot. Pain decreases with stretching, ice and rest PAIN:  Are you having pain? Yes: NPRS scale: 2/10 currently in Rt arch, up to 7-8/10 with prolonged standing and walking/10 Pain location: Rt arch Pain description: sore, tight Aggravating factors: prolonged standing and walking Relieving factors: rest, ice  PRECAUTIONS: None  WEIGHT BEARING RESTRICTIONS: No  FALLS:  Has patient fallen in last 6 months? 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: Independent  PATIENT GOALS: work with decreased pain  NEXT MD VISIT: 07/29/22  OBJECTIVE:    PALPATION: TTP Rt plantar fascia, Rt and Lt posterior tibialis tendons TC and metatarsal jt mobility WFL bilat  LOWER EXTREMITY ROM:  MMT Right eval Left eval  Hip flexion    Hip extension    Hip abduction    Hip adduction    Hip internal rotation    Hip external rotation    Knee flexion    Knee  extension    Ankle dorsiflexion 4 4+  Ankle plantarflexion 4 pain 4  Ankle inversion 4+ 4  Ankle eversion 4 4+   (Blank rows = not tested)  LOWER EXTREMITY MMT:  AROM Right eval Left eval  Hip flexion    Hip extension    Hip abduction    Hip adduction    Hip internal rotation    Hip external rotation    Knee flexion    Knee extension    Ankle dorsiflexion 4 6  Ankle plantarflexion 50 55  Ankle inversion 18 20  Ankle eversion 22 pain 32   (Blank rows = not tested)   FUNCTIONAL TESTS:  No pain with SLS bilat    TODAY'S TREATMENT:                                                                                                                               [facility] Adult PT Treatment:                                                DATE: 06/23/22 Therapeutic Exercise: Recumbent bike x 5 min L3 Gastroc stretch 2 x 30 sec Soleus stretch 2 x 30 sec Ankle inversion red TB x 15 bilat Ankle eversion red TB x 15 bilat Toe yoga x 10 bilat Arch strengthening x 10 bilat Seated heel raise 15# KB x 20 bilat Standing heel raise x 20 Standing heel raise with ball squeeze between heels x 20 Tandem stance on foam 2 x 30 sec SLS x 30 sec bilat Sidestep red TB around feet 20' x 2 SLS with 3 way reach x 10    DATE: 06/16/22 See HEP    PATIENT EDUCATION:  Education details: PT POC and goals, HEP Person educated: Patient Education method: Explanation, Demonstration, and Handouts Education comprehension: verbalized understanding and returned demonstration  HOME EXERCISE PROGRAM: Access Code: [email or web address] URL: [email or web address] Date: 06/23/2022 Prepared by: Reggy Eye  Exercises - Ankle Inversion with Resistance  - 1 x daily - 7 x weekly - 3 sets - 10 reps - Ankle Eversion with Resistance  - 1 x daily - 7 x weekly - 3 sets - 10 reps - Toe Yoga - Alternating Great Toe and Lesser Toe Extension  - 1 x daily - 7 x weekly - 1 sets - 10 reps - Seated Heel Raise  - 1 x daily - 7 x weekly - 3 sets - 10 reps - Gastroc Stretch on Wall  - 1 x daily - 7 x weekly - 1 sets - 3 reps - 20-30 sec hold - Soleus Stretch on Wall  - 1 x daily - 7 x weekly - 1 sets - 3 reps - 20-30 sec hold - Heel Raises with Counter Support  - 1 x daily - 7 x  weekly - 3 sets - 10 reps - Standing Heel Raise with Toes Turned Out  - 1 x daily - 7 x weekly - 3 sets - 10 reps - Side Stepping with Resistance at Feet  - 1 x daily - 7 x weekly - 3 sets - 10 reps - Single Leg Balance with Clock Reach  - 1 x daily - 7 x weekly - 3 sets - 10 reps  ASSESSMENT:  CLINICAL IMPRESSION: Pt did well with progression of exercises, no  increase in symptoms. Challenged by balance tasks and standing therex. HEP updated  OBJECTIVE IMPAIRMENTS: decreased activity tolerance, difficulty walking, decreased ROM, decreased strength, and pain.     GOALS: Goals reviewed with patient? Yes  SHORT TERM GOALS: Target date: 06/30/2022   Pt will be independent with initial HEP Baseline: Goal status: INITIAL    LONG TERM GOALS: Target date: 07/28/2022    Pt will be independent with advanced HEP Baseline:  Goal status: INITIAL  2.  Pt will improve bilat ankle strength to 5/5 to be able work with decreased pain Baseline:  Goal status: INITIAL  3.  Pt will tolerate working 12 hour shift with foot/ankle pain <= 2/10 Baseline:  Goal status: INITIAL    PLAN:  PT FREQUENCY: 1-2x/week  PT DURATION: 6 weeks  PLANNED INTERVENTIONS: Therapeutic exercises, Therapeutic activity, Neuromuscular re-education, Balance training, Gait training, Patient/Family education, Self Care, Joint mobilization, Dry Needling, Electrical stimulation, Cryotherapy, Moist heat, Taping, Vasopneumatic device, Ultrasound, Ionotophoresis 4mg /ml Dexamethasone, Manual therapy, and Re-evaluation  PLAN FOR NEXT SESSION:  foot and ankle strength, balance/coordination   Renarda Mullinix, PT 06/23/2022, 12:19 PM

## 2022-06-30 ENCOUNTER — Encounter: Payer: Commercial Managed Care - PPO | Admitting: Physical Therapy

## 2022-07-01 ENCOUNTER — Encounter: Payer: Self-pay | Admitting: Obstetrics & Gynecology

## 2022-07-01 ENCOUNTER — Ambulatory Visit: Payer: Commercial Managed Care - PPO | Admitting: Physical Therapy

## 2022-07-01 ENCOUNTER — Ambulatory Visit: Payer: Commercial Managed Care - PPO | Admitting: Obstetrics & Gynecology

## 2022-07-01 ENCOUNTER — Encounter: Payer: Self-pay | Admitting: Physical Therapy

## 2022-07-01 VITALS — BP 116/70 | HR 70 | Ht 61.5 in | Wt 183.0 lb

## 2022-07-01 DIAGNOSIS — M76822 Posterior tibial tendinitis, left leg: Secondary | ICD-10-CM | POA: Diagnosis not present

## 2022-07-01 DIAGNOSIS — M76821 Posterior tibial tendinitis, right leg: Secondary | ICD-10-CM

## 2022-07-01 DIAGNOSIS — M722 Plantar fascial fibromatosis: Secondary | ICD-10-CM | POA: Diagnosis not present

## 2022-07-01 DIAGNOSIS — Z3A01 Less than 8 weeks gestation of pregnancy: Secondary | ICD-10-CM | POA: Diagnosis not present

## 2022-07-01 DIAGNOSIS — O039 Complete or unspecified spontaneous abortion without complication: Secondary | ICD-10-CM | POA: Diagnosis not present

## 2022-07-01 DIAGNOSIS — Z5189 Encounter for other specified aftercare: Secondary | ICD-10-CM

## 2022-07-01 NOTE — Progress Notes (Signed)
   GYNECOLOGY OFFICE VISIT NOTE  History:   Elizabeth Perkins is a 36 y.o. G2P1001 here today for follow up after recent early SAB.  She is doing well, no concerns.  She denies any abnormal vaginal discharge, bleeding, pelvic pain or other concerns.    Past Medical History:  Diagnosis Date   Abnormal Pap smear of cervix    HGSIL 2019 Never followed up   Cystic fibrosis gene carrier 04/14/2020   FOB has negative CF testing.    HGSIL (high grade squamous intraepithelial lesion) on Pap smear of cervix 12/15/2016   Formatting of this note is different from the original.  ASCUS/HPV+ on 12-02-16  02/2017: CIN 1  Needs cotesting in 11/2017   HPV in female    Plantar fasciitis, right    Vaginal Pap smear, abnormal    ASCUS   Vitamin D deficiency     Past Surgical History:  Procedure Laterality Date   CESAREAN SECTION N/A 10/03/2020   Procedure: CESAREAN SECTION;  Surgeon: Warden Fillers, MD;  Location: [facility] LD ORS;  Service: Obstetrics;  Laterality: N/A;   COLPOSCOPY     The following portions of the patient's history were reviewed and updated as appropriate: allergies, current medications, past family history, past medical history, past social history, past surgical history and problem list.  Normal pap with negative HRHPV on 11/11/2021; this was first normal pap after HGSIL pap in 2022.   Review of Systems:  Pertinent items noted in HPI and remainder of comprehensive ROS otherwise negative.  Physical Exam:  BP 116/70   Pulse 70   Ht 5' 1.5" (1.562 m)   Wt 183 lb (83 kg)   LMP 03/10/2022   Breastfeeding Unknown   BMI 34.02 kg/m  CONSTITUTIONAL: Well-developed, well-nourished female in no acute distress.  HEENT:  Normocephalic, atraumatic. External right and left ear normal. No scleral icterus.  NECK: Normal range of motion, supple, no masses noted on observation SKIN: No rash noted. Not diaphoretic. No erythema. No pallor. MUSCULOSKELETAL: Normal range of motion. No edema  noted. NEUROLOGIC: Alert and oriented to person, place, and time. Normal muscle tone coordination. No cranial nerve deficit noted. PSYCHIATRIC: Normal mood and affect. Normal behavior. Normal judgment and thought content. CARDIOVASCULAR: Normal heart rate noted RESPIRATORY: Effort and breath sounds normal, no problems with respiration noted ABDOMEN: No masses noted. No other overt distention noted.   PELVIC: Deferred     Assessment and Plan:    1. Follow-up visit after miscarriage She is doing well. No concerns.  Plans to conceive again soon.  Will continue taking prenatal vitamins, avoid teratogens, use ovulation tracker apps and ovulation prediction kits as needed. Optimization of other health issues recommended.     Routine preventative health maintenance measures emphasized. Please refer to After Visit Summary for other counseling recommendations.   Return in about 4 months (around 11/12/2022) for Pap smear.    I spent 25 minutes dedicated to the care of this patient including pre-visit review of records, face to face time with the patient discussing her conditions and treatments and post visit orders.    Jaynie Collins, MD, FACOG Obstetrician & Gynecologist, [facility] for [employer], [facility]

## 2022-07-01 NOTE — Therapy (Signed)
OUTPATIENT PHYSICAL THERAPY LOWER EXTREMITY TREATMENT   Patient Name: Elizabeth Perkins MRN: [id number] DOB:Aug 01, 1985, 37 y.o., female Today's Date: 07/01/2022  END OF SESSION:  PT End of Session - 07/01/22 1620     Visit Number 3    Number of Visits 12    Date for PT Re-Evaluation 07/28/22    PT Start Time 1617    PT Stop Time 1700    PT Time Calculation (min) 43 min    Activity Tolerance Patient tolerated treatment well    Behavior During Therapy [facility] for tasks assessed/performed             Past Medical History:  Diagnosis Date   Abnormal Pap smear of cervix    HGSIL 2019 Never followed up   Cystic fibrosis gene carrier 04/14/2020   FOB has negative CF testing.    HGSIL (high grade squamous intraepithelial lesion) on Pap smear of cervix 12/15/2016   Formatting of this note is different from the original.  ASCUS/HPV+ on 12-02-16  02/2017: CIN 1  Needs cotesting in 11/2017   HPV in female    Plantar fasciitis, right    Vaginal Pap smear, abnormal    ASCUS   Vitamin D deficiency    Past Surgical History:  Procedure Laterality Date   CESAREAN SECTION N/A 10/03/2020   Procedure: CESAREAN SECTION;  Surgeon: Warden Fillers, MD;  Location: [facility] LD ORS;  Service: Obstetrics;  Laterality: N/A;   COLPOSCOPY     There are no problems to display for this patient.   PCP: Juliene Pina  REFERRING PROVIDER: Louann Sjogren  REFERRING DIAG: bilat posterior tibial tendon dysfunction, bilat plantar fasciitis  THERAPY DIAG:  Posterior tibial tendon dysfunction (PTTD) of right lower extremity  Posterior tibial tendon dysfunction (PTTD) of left lower extremity  Bilateral plantar fasciitis  Rationale for Evaluation and Treatment: Rehabilitation  ONSET DATE: 05/2022  SUBJECTIVE:   SUBJECTIVE STATEMENT: Pt states her foot is hurting -- her work has not been fully staffed. Is looking to transfer to ED.   PERTINENT HISTORY: Plantar fasciitis 2022 Pt has been having Rt arch  and medial ankle pain worsening for the past few months, she states the pain is beginning to start on her Lt foot too. Pain increases with prolonged walking and standing. She wears a brace PRN on Lt foot, at all times when out of bed on Rt foot. Pain decreases with stretching, ice and rest PAIN:  Are you having pain? Yes: NPRS scale: 2/10 currently in Rt arch, up to 7-8/10 with prolonged standing and walking/10 Pain location: Rt arch Pain description: sore, tight Aggravating factors: prolonged standing and walking Relieving factors: rest, ice  PRECAUTIONS: None  WEIGHT BEARING RESTRICTIONS: No  FALLS:  Has patient fallen in last 6 months? 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: Independent  PATIENT GOALS: work with decreased pain  NEXT MD VISIT: 07/29/22  OBJECTIVE:    PALPATION: TTP Rt plantar fascia, Rt and Lt posterior tibialis tendons TC and metatarsal jt mobility WFL bilat  LOWER EXTREMITY ROM:  MMT Right eval Left eval  Hip flexion    Hip extension    Hip abduction    Hip adduction    Hip internal rotation    Hip external rotation    Knee flexion    Knee extension    Ankle dorsiflexion 4 4+  Ankle plantarflexion 4 pain 4  Ankle inversion 4+ 4  Ankle eversion 4 4+   (Blank rows = not  tested)  LOWER EXTREMITY MMT:  AROM Right eval Left eval  Hip flexion    Hip extension    Hip abduction    Hip adduction    Hip internal rotation    Hip external rotation    Knee flexion    Knee extension    Ankle dorsiflexion 4 6  Ankle plantarflexion 50 55  Ankle inversion 18 20  Ankle eversion 22 pain 32   (Blank rows = not tested)   FUNCTIONAL TESTS:  No pain with SLS bilat    TODAY'S TREATMENT:           [facility] Adult PT Treatment:                                                DATE: 07/01/22 Therapeutic Exercise: Recumbent bike L3 x 5 min Gastroc stretch 2 x 30 sec Soleus stretch 2 x 30 sec Toe yoga 2x 10 bilat Towel scrunch 2x10  Ankle  eversion stretch x30 sec Toe flexor stretch x30 sec Arch lifting x10 Arch lifting + sit<>stand x5 Standing heel raise with ball squeeze between heels x 20 SLS 2x 30 sec bilat Hip 3 way with slider 2x 10 Woodpecker x10                                                                                                                      [facility] Adult PT Treatment:                                                DATE: 06/23/22 Therapeutic Exercise: Recumbent bike x 5 min L3 Gastroc stretch 2 x 30 sec Soleus stretch 2 x 30 sec Ankle inversion red TB x 15 bilat Ankle eversion red TB x 15 bilat Toe yoga x 10 bilat Arch strengthening x 10 bilat Seated heel raise 15# KB x 20 bilat Standing heel raise x 20 Standing heel raise with ball squeeze between heels x 20 Tandem stance on foam 2 x 30 sec SLS x 30 sec bilat Sidestep red TB around feet 20' x 2 SLS with 3 way reach x 10    DATE: 06/16/22 See HEP    PATIENT EDUCATION:  Education details: PT POC and goals, HEP Person educated: Patient Education method: Explanation, Demonstration, and Handouts Education comprehension: verbalized understanding and returned demonstration  HOME EXERCISE PROGRAM: Access Code: [email or web address] URL: [email or web address] Date: 06/23/2022 Prepared by: Reggy Eye  Exercises - Ankle Inversion with Resistance  - 1 x daily - 7 x weekly - 3 sets - 10 reps - Ankle Eversion with Resistance  - 1 x daily - 7 x weekly - 3 sets - 10 reps - Toe Yoga - Alternating Great Toe and Lesser Toe Extension  -  1 x daily - 7 x weekly - 1 sets - 10 reps - Seated Heel Raise  - 1 x daily - 7 x weekly - 3 sets - 10 reps - Gastroc Stretch on Wall  - 1 x daily - 7 x weekly - 1 sets - 3 reps - 20-30 sec hold - Soleus Stretch on Wall  - 1 x daily - 7 x weekly - 1 sets - 3 reps - 20-30 sec hold - Heel Raises with Counter Support  - 1 x daily - 7 x weekly - 3 sets - 10 reps - Standing Heel Raise with Toes Turned Out  - 1 x  daily - 7 x weekly - 3 sets - 10 reps - Side Stepping with Resistance at Feet  - 1 x daily - 7 x weekly - 3 sets - 10 reps - Single Leg Balance with Clock Reach  - 1 x daily - 7 x weekly - 3 sets - 10 reps  ASSESSMENT:  CLINICAL IMPRESSION: Continued to progress arch lifting exercises into standing. Difficulty with toe yoga. Working on improving standing stability and strength.   OBJECTIVE IMPAIRMENTS: decreased activity tolerance, difficulty walking, decreased ROM, decreased strength, and pain.     GOALS: Goals reviewed with patient? Yes  SHORT TERM GOALS: Target date: 06/30/2022   Pt will be independent with initial HEP Baseline: Goal status: INITIAL    LONG TERM GOALS: Target date: 07/28/2022    Pt will be independent with advanced HEP Baseline:  Goal status: INITIAL  2.  Pt will improve bilat ankle strength to 5/5 to be able work with decreased pain Baseline:  Goal status: INITIAL  3.  Pt will tolerate working 12 hour shift with foot/ankle pain <= 2/10 Baseline:  Goal status: INITIAL    PLAN:  PT FREQUENCY: 1-2x/week  PT DURATION: 6 weeks  PLANNED INTERVENTIONS: Therapeutic exercises, Therapeutic activity, Neuromuscular re-education, Balance training, Gait training, Patient/Family education, Self Care, Joint mobilization, Dry Needling, Electrical stimulation, Cryotherapy, Moist heat, Taping, Vasopneumatic device, Ultrasound, Ionotophoresis 4mg /ml Dexamethasone, Manual therapy, and Re-evaluation  PLAN FOR NEXT SESSION:  foot and ankle strength, balance/coordination   Janece Laidlaw April Ma L Myers [place], PT 07/01/2022, 4:21 PM

## 2022-07-02 IMAGING — DX DG FOOT COMPLETE 3+V*R*
3 series · 3 of 3 positions shown · non-contrast
Comparison: None.

CLINICAL DATA: Pain right foot

EXAM:
RIGHT FOOT COMPLETE - 3+ VIEW

[foot ap]
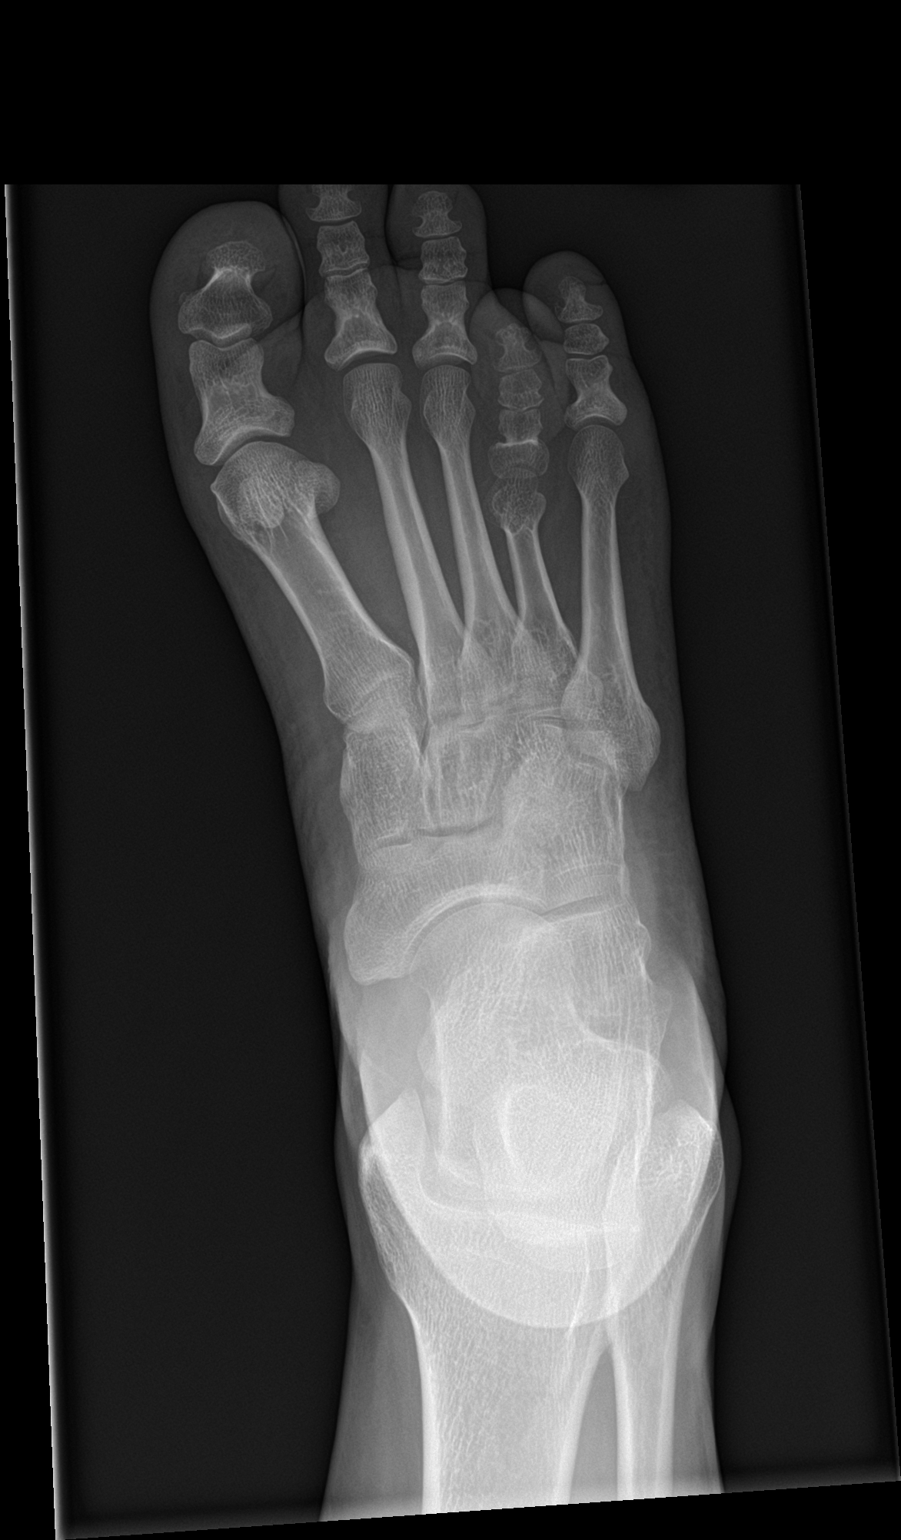

[foot obl]
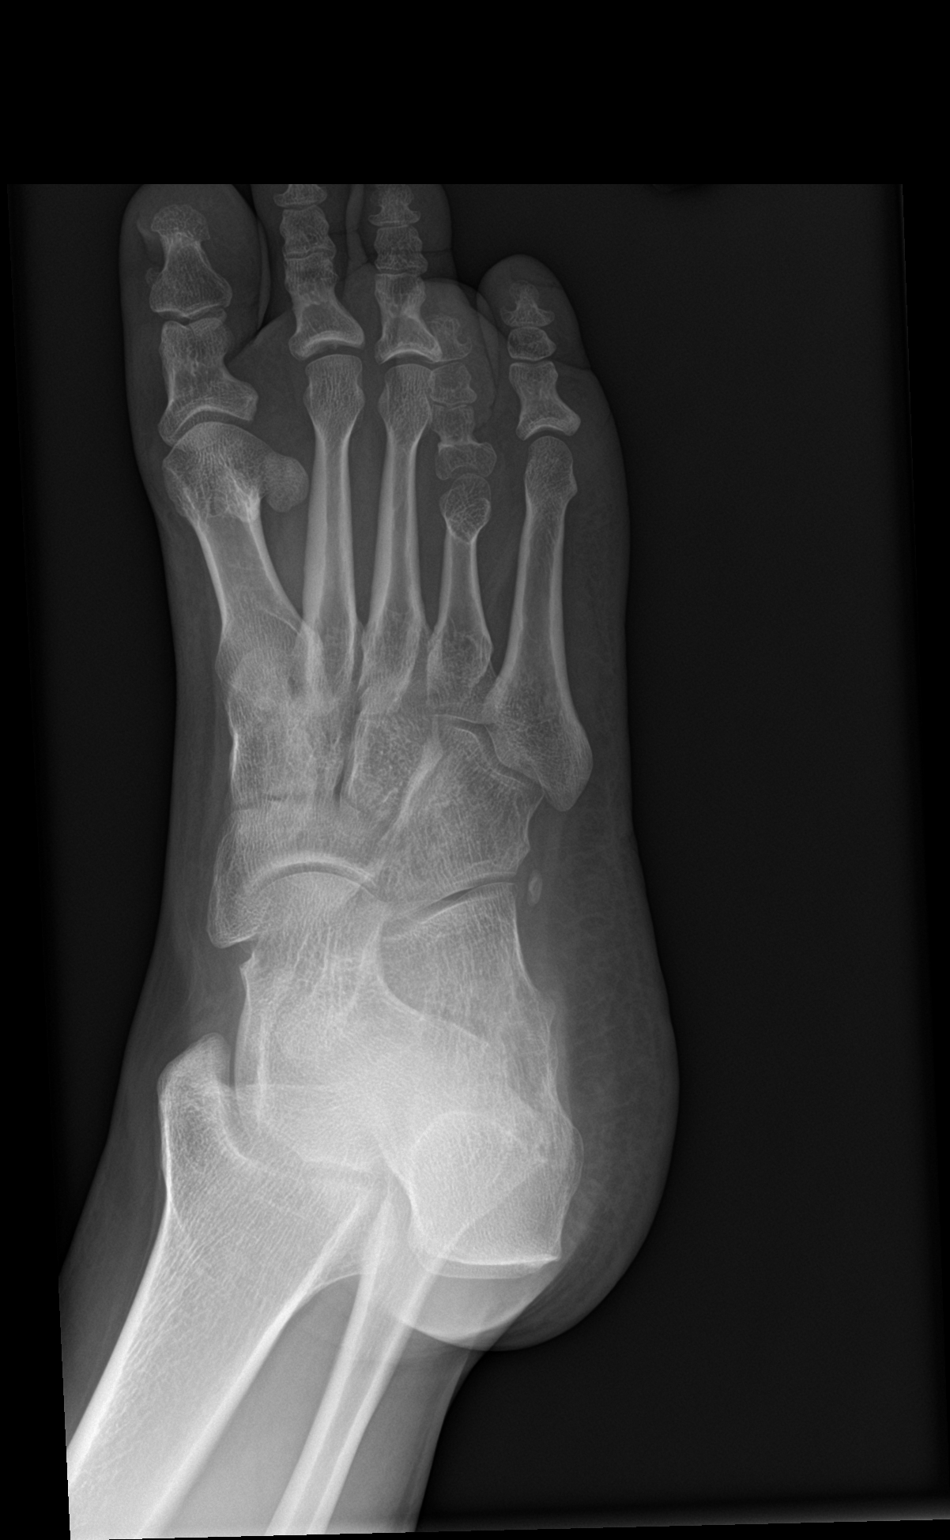

[foot lat]
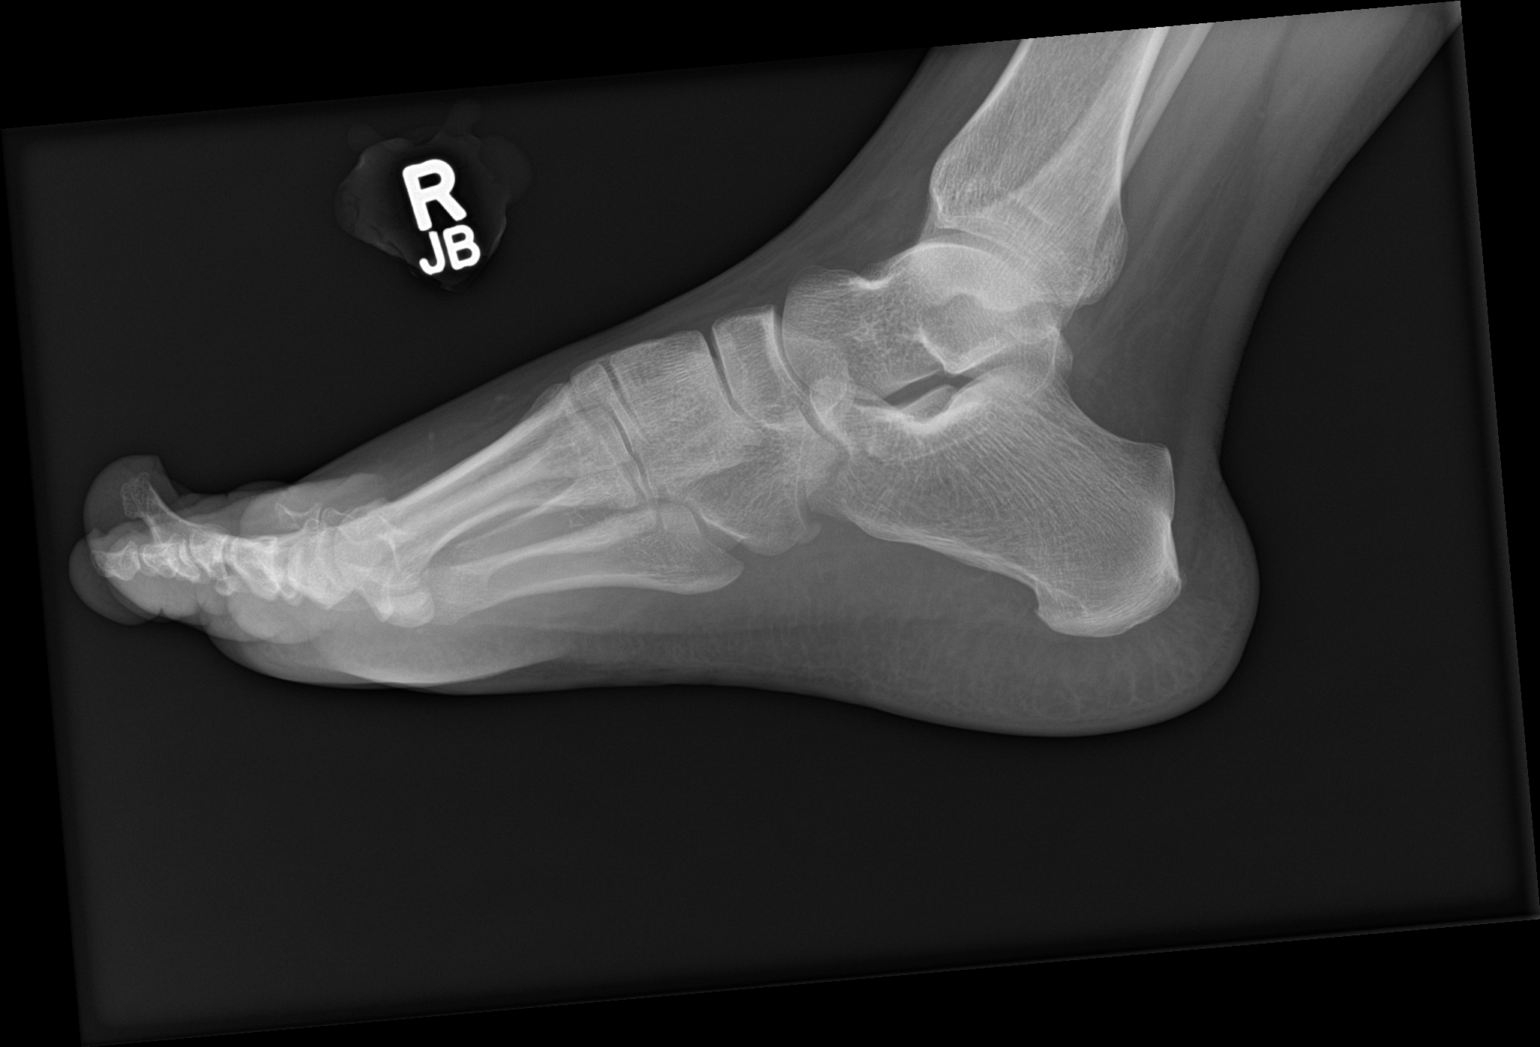

[3 of 3 positions shown; findings below may reference images not displayed]

FINDINGS: No recent fracture or dislocation is seen. There are no focal lytic
lesions. Fourth metatarsal is shorter than usual.
IMPRESSION: No significant radiographic abnormality is seen in the right foot.

## 2022-07-13 ENCOUNTER — Encounter: Payer: Self-pay | Admitting: Physical Therapy

## 2022-07-13 ENCOUNTER — Ambulatory Visit: Payer: Commercial Managed Care - PPO | Admitting: Physical Therapy

## 2022-07-13 DIAGNOSIS — M76822 Posterior tibial tendinitis, left leg: Secondary | ICD-10-CM

## 2022-07-13 DIAGNOSIS — M76821 Posterior tibial tendinitis, right leg: Secondary | ICD-10-CM

## 2022-07-13 DIAGNOSIS — M722 Plantar fascial fibromatosis: Secondary | ICD-10-CM

## 2022-07-13 NOTE — Therapy (Signed)
OUTPATIENT PHYSICAL THERAPY LOWER EXTREMITY TREATMENT   Patient Name: Elizabeth Perkins MRN: [id number] DOB:[birth year], 37 y.o., female Today's Date: 07/13/2022  END OF SESSION:  PT End of Session - 07/13/22 1529     Visit Number 4    Number of Visits 12    Date for PT Re-Evaluation 07/28/22    PT Start Time 1445    PT Stop Time 1529    PT Time Calculation (min) 44 min    Activity Tolerance Patient tolerated treatment well    Behavior During Therapy [facility] for tasks assessed/performed              Past Medical History:  Diagnosis Date   Abnormal Pap smear of cervix    HGSIL 2019 Never followed up   Cystic fibrosis gene carrier 04/14/2020   FOB has negative CF testing.    HGSIL (high grade squamous intraepithelial lesion) on Pap smear of cervix 12/15/2016   Formatting of this note is different from the original.  ASCUS/HPV+ on 12-02-16  02/2017: CIN 1  Needs cotesting in 11/2017   HPV in female    Plantar fasciitis, right    Vaginal Pap smear, abnormal    ASCUS   Vitamin D deficiency    Past Surgical History:  Procedure Laterality Date   CESAREAN SECTION N/A 10/03/2020   Procedure: CESAREAN SECTION;  Surgeon: Warden Fillers, MD;  Location: [facility] LD ORS;  Service: Obstetrics;  Laterality: N/A;   COLPOSCOPY     There are no problems to display for this patient.   PCP: Juliene Pina  REFERRING PROVIDER: Louann Sjogren  REFERRING DIAG: bilat posterior tibial tendon dysfunction, bilat plantar fasciitis  THERAPY DIAG:  Posterior tibial tendon dysfunction (PTTD) of right lower extremity  Posterior tibial tendon dysfunction (PTTD) of left lower extremity  Bilateral plantar fasciitis  Rationale for Evaluation and Treatment: Rehabilitation  ONSET DATE: 05/2022  SUBJECTIVE:   SUBJECTIVE STATEMENT: Pt states her foot is hurting. She worked at [employer] today and worked at the hospital yesterday. She plans to transfer to the ED at the end of June  PERTINENT  HISTORY: Plantar fasciitis 2022 Pt has been having Rt arch and medial ankle pain worsening for the past few months, she states the pain is beginning to start on her Lt foot too. Pain increases with prolonged walking and standing. She wears a brace PRN on Lt foot, at all times when out of bed on Rt foot. Pain decreases with stretching, ice and rest PAIN:  Are you having pain? Yes: NPRS scale: 6/10 currently in Rt arch, up to 7-8/10 with prolonged standing and walking/10 Pain location: Rt arch Pain description: sore, tight Aggravating factors: prolonged standing and walking Relieving factors: rest, ice  PRECAUTIONS: None  WEIGHT BEARING RESTRICTIONS: No  FALLS:  Has patient fallen in last 6 months? 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: Independent  PATIENT GOALS: work with decreased pain  NEXT MD VISIT: 07/29/22  OBJECTIVE:    PALPATION: TTP Rt plantar fascia, Rt and Lt posterior tibialis tendons TC and metatarsal jt mobility WFL bilat  LOWER EXTREMITY ROM:  MMT Right eval Left eval  Hip flexion    Hip extension    Hip abduction    Hip adduction    Hip internal rotation    Hip external rotation    Knee flexion    Knee extension    Ankle dorsiflexion 4 4+  Ankle plantarflexion 4 pain 4  Ankle inversion 4+ 4  Ankle eversion 4 4+   (Blank rows = not tested)  LOWER EXTREMITY MMT:  AROM Right eval Left eval  Hip flexion    Hip extension    Hip abduction    Hip adduction    Hip internal rotation    Hip external rotation    Knee flexion    Knee extension    Ankle dorsiflexion 4 6  Ankle plantarflexion 50 55  Ankle inversion 18 20  Ankle eversion 22 pain 32   (Blank rows = not tested)   FUNCTIONAL TESTS:  No pain with SLS bilat    TODAY'S TREATMENT:           [facility] Adult PT Treatment:                                                DATE: 07/13/22 Therapeutic Exercise: Recumbent L3 x 5 min for warm up Gastroc stretch 2 x 30 sec Soleus  stretch 2 x 30 sec Toe yoga x 10 Arch lifting x 10 Towel scrunch x 1 min Heel raise with ball squeeze between heels x 20 Tandem stance on foam 2 x 30 sec SLS x 30 sec bilat Woodpecker x 10 bilat Sidestep with red TB around feet 4 x 10' Manual Therapy: STM bilat heel, plantar fascia, gastroc/soleus   [facility] Adult PT Treatment:                                                DATE: 07/01/22 Therapeutic Exercise: Recumbent bike L3 x 5 min Gastroc stretch 2 x 30 sec Soleus stretch 2 x 30 sec Toe yoga 2x 10 bilat Towel scrunch 2x10  Ankle eversion stretch x30 sec Toe flexor stretch x30 sec Arch lifting x10 Arch lifting + sit<>stand x5 Standing heel raise with ball squeeze between heels x 20 SLS 2x 30 sec bilat Hip 3 way with slider 2x 10 Woodpecker x10                                                                                                                      [facility] Adult PT Treatment:                                                DATE: 06/23/22 Therapeutic Exercise: Recumbent bike x 5 min L3 Gastroc stretch 2 x 30 sec Soleus stretch 2 x 30 sec Ankle inversion red TB x 15 bilat Ankle eversion red TB x 15 bilat Toe yoga x 10 bilat Arch strengthening x 10 bilat Seated heel raise 15# KB x 20 bilat Standing heel raise x  20 Standing heel raise with ball squeeze between heels x 20 Tandem stance on foam 2 x 30 sec SLS x 30 sec bilat Sidestep red TB around feet 20' x 2 SLS with 3 way reach x 10    PATIENT EDUCATION:  Education details: PT POC and goals, HEP Person educated: Patient Education method: Explanation, Demonstration, and Handouts Education comprehension: verbalized understanding and returned demonstration  HOME EXERCISE PROGRAM: Access Code: [email or web address] URL: [email or web address] Date: 06/23/2022 Prepared by: Reggy Eye  Exercises - Ankle Inversion with Resistance  - 1 x daily - 7 x weekly - 3 sets - 10 reps - Ankle Eversion with Resistance   - 1 x daily - 7 x weekly - 3 sets - 10 reps - Toe Yoga - Alternating Great Toe and Lesser Toe Extension  - 1 x daily - 7 x weekly - 1 sets - 10 reps - Seated Heel Raise  - 1 x daily - 7 x weekly - 3 sets - 10 reps - Gastroc Stretch on Wall  - 1 x daily - 7 x weekly - 1 sets - 3 reps - 20-30 sec hold - Soleus Stretch on Wall  - 1 x daily - 7 x weekly - 1 sets - 3 reps - 20-30 sec hold - Heel Raises with Counter Support  - 1 x daily - 7 x weekly - 3 sets - 10 reps - Standing Heel Raise with Toes Turned Out  - 1 x daily - 7 x weekly - 3 sets - 10 reps - Side Stepping with Resistance at Feet  - 1 x daily - 7 x weekly - 3 sets - 10 reps - Single Leg Balance with Clock Reach  - 1 x daily - 7 x weekly - 3 sets - 10 reps  ASSESSMENT:  CLINICAL IMPRESSION: Manual performed today due to increased pain when pt arrived. Pt with increased mm spasticity throughout bilat feet and ankles. Pt reports good relief with manual work. She continues to have difficulty with SL balance but is improving exercise tolerance each session  OBJECTIVE IMPAIRMENTS: decreased activity tolerance, difficulty walking, decreased ROM, decreased strength, and pain.     GOALS: Goals reviewed with patient? Yes  SHORT TERM GOALS: Target date: 06/30/2022   Pt will be independent with initial HEP Baseline: Goal status: MET    LONG TERM GOALS: Target date: 07/28/2022    Pt will be independent with advanced HEP Baseline:  Goal status: INITIAL  2.  Pt will improve bilat ankle strength to 5/5 to be able work with decreased pain Baseline:  Goal status: INITIAL  3.  Pt will tolerate working 12 hour shift with foot/ankle pain <= 2/10 Baseline:  Goal status: INITIAL    PLAN:  PT FREQUENCY: 1-2x/week  PT DURATION: 6 weeks  PLANNED INTERVENTIONS: Therapeutic exercises, Therapeutic activity, Neuromuscular re-education, Balance training, Gait training, Patient/Family education, Self Care, Joint mobilization, Dry  Needling, Electrical stimulation, Cryotherapy, Moist heat, Taping, Vasopneumatic device, Ultrasound, Ionotophoresis 4mg /ml Dexamethasone, Manual therapy, and Re-evaluation  PLAN FOR NEXT SESSION:  foot and ankle strength, balance/coordination, manual as indicated   Kerrin Markman, PT 07/13/2022, 3:29 PM

## 2022-07-19 ENCOUNTER — Ambulatory Visit: Payer: Commercial Managed Care - PPO | Attending: Podiatry | Admitting: Physical Therapy

## 2022-07-19 ENCOUNTER — Encounter: Payer: Self-pay | Admitting: Physical Therapy

## 2022-07-19 DIAGNOSIS — M76821 Posterior tibial tendinitis, right leg: Secondary | ICD-10-CM | POA: Diagnosis not present

## 2022-07-19 DIAGNOSIS — M76822 Posterior tibial tendinitis, left leg: Secondary | ICD-10-CM | POA: Diagnosis not present

## 2022-07-19 DIAGNOSIS — M722 Plantar fascial fibromatosis: Secondary | ICD-10-CM | POA: Diagnosis not present

## 2022-07-19 NOTE — Therapy (Signed)
OUTPATIENT PHYSICAL THERAPY LOWER EXTREMITY TREATMENT   Patient Name: Elizabeth Perkins MRN: [id number] DOB:November 01, 1985, 37 y.o., female Today's Date: 07/19/2022  END OF SESSION:  PT End of Session - 07/19/22 1611     Visit Number 5    Number of Visits 12    Date for PT Re-Evaluation 07/28/22    PT Start Time 1525    PT Stop Time 1612    PT Time Calculation (min) 47 min    Activity Tolerance Patient tolerated treatment well    Behavior During Therapy [facility] for tasks assessed/performed               Past Medical History:  Diagnosis Date   Abnormal Pap smear of cervix    HGSIL 2019 Never followed up   Cystic fibrosis gene carrier 04/14/2020   FOB has negative CF testing.    HGSIL (high grade squamous intraepithelial lesion) on Pap smear of cervix 12/15/2016   Formatting of this note is different from the original.  ASCUS/HPV+ on 12-02-16  02/2017: CIN 1  Needs cotesting in 11/2017   HPV in female    Plantar fasciitis, right    Vaginal Pap smear, abnormal    ASCUS   Vitamin D deficiency    Past Surgical History:  Procedure Laterality Date   CESAREAN SECTION N/A 10/03/2020   Procedure: CESAREAN SECTION;  Surgeon: Warden Fillers, MD;  Location: [facility] LD ORS;  Service: Obstetrics;  Laterality: N/A;   COLPOSCOPY     There are no problems to display for this patient.   PCP: Juliene Pina  REFERRING PROVIDER: Louann Sjogren  REFERRING DIAG: bilat posterior tibial tendon dysfunction, bilat plantar fasciitis  THERAPY DIAG:  Posterior tibial tendon dysfunction (PTTD) of right lower extremity  Posterior tibial tendon dysfunction (PTTD) of left lower extremity  Bilateral plantar fasciitis  Rationale for Evaluation and Treatment: Rehabilitation  ONSET DATE: 05/2022  SUBJECTIVE:   SUBJECTIVE STATEMENT: Pt states she is feeling good today because she sat most of the day at the hotel. She states she felt better after manual work last visit  PERTINENT HISTORY: Plantar  fasciitis 2022 Pt has been having Rt arch and medial ankle pain worsening for the past few months, she states the pain is beginning to start on her Lt foot too. Pain increases with prolonged walking and standing. She wears a brace PRN on Lt foot, at all times when out of bed on Rt foot. Pain decreases with stretching, ice and rest PAIN:  Are you having pain? Yes: NPRS scale: 3/10 currently in Rt arch, up to 7-8/10 with prolonged standing and walking/10 Pain location: Rt arch Pain description: sore, tight Aggravating factors: prolonged standing and walking Relieving factors: rest, ice  PRECAUTIONS: None  WEIGHT BEARING RESTRICTIONS: No  FALLS:  Has patient fallen in last 6 months? 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: Independent  PATIENT GOALS: work with decreased pain  NEXT MD VISIT: 07/29/22  OBJECTIVE:    PALPATION: TTP Rt plantar fascia, Rt and Lt posterior tibialis tendons TC and metatarsal jt mobility WFL bilat  LOWER EXTREMITY ROM:  MMT Right eval Left eval  Hip flexion    Hip extension    Hip abduction    Hip adduction    Hip internal rotation    Hip external rotation    Knee flexion    Knee extension    Ankle dorsiflexion 4 4+  Ankle plantarflexion 4 pain 4  Ankle inversion 4+ 4  Ankle eversion  4 4+   (Blank rows = not tested)  LOWER EXTREMITY MMT:  AROM Right eval Left eval  Hip flexion    Hip extension    Hip abduction    Hip adduction    Hip internal rotation    Hip external rotation    Knee flexion    Knee extension    Ankle dorsiflexion 4 6  Ankle plantarflexion 50 55  Ankle inversion 18 20  Ankle eversion 22 pain 32   (Blank rows = not tested)   FUNCTIONAL TESTS:  No pain with SLS bilat    TODAY'S TREATMENT:           [facility] Adult PT Treatment:                                                DATE: 07/19/22 Therapeutic Exercise: Recumbent L3 x 5 min Heel raises toes straight, in, out x 20 each Woodpecker x 10  bilat Forward lunge onto bosu x 10 bilat SL pallof green TB x 10 bilat Standing arch lift x 10 Toe yoga x 10 bilat Manual Therapy: STM bilat heel, plantar fascia, gastroc/soleus   [facility] Adult PT Treatment:                                                DATE: 07/13/22 Therapeutic Exercise: Recumbent L3 x 5 min for warm up Gastroc stretch 2 x 30 sec Soleus stretch 2 x 30 sec Toe yoga x 10 Arch lifting x 10 Towel scrunch x 1 min Heel raise with ball squeeze between heels x 20 Tandem stance on foam 2 x 30 sec SLS x 30 sec bilat Woodpecker x 10 bilat Sidestep with red TB around feet 4 x 10' Manual Therapy: STM bilat heel, plantar fascia, gastroc/soleus    PATIENT EDUCATION:  Education details: PT POC and goals, HEP Person educated: Patient Education method: Explanation, Demonstration, and Handouts Education comprehension: verbalized understanding and returned demonstration  HOME EXERCISE PROGRAM: Access Code: [email or web address] URL: [email or web address] Date: 06/23/2022 Prepared by: Reggy Eye  Exercises - Ankle Inversion with Resistance  - 1 x daily - 7 x weekly - 3 sets - 10 reps - Ankle Eversion with Resistance  - 1 x daily - 7 x weekly - 3 sets - 10 reps - Toe Yoga - Alternating Great Toe and Lesser Toe Extension  - 1 x daily - 7 x weekly - 1 sets - 10 reps - Seated Heel Raise  - 1 x daily - 7 x weekly - 3 sets - 10 reps - Gastroc Stretch on Wall  - 1 x daily - 7 x weekly - 1 sets - 3 reps - 20-30 sec hold - Soleus Stretch on Wall  - 1 x daily - 7 x weekly - 1 sets - 3 reps - 20-30 sec hold - Heel Raises with Counter Support  - 1 x daily - 7 x weekly - 3 sets - 10 reps - Standing Heel Raise with Toes Turned Out  - 1 x daily - 7 x weekly - 3 sets - 10 reps - Side Stepping with Resistance at Feet  - 1 x daily - 7 x weekly - 3 sets - 10 reps - Single  Leg Balance with Clock Reach  - 1 x daily - 7 x weekly - 3 sets - 10 reps  ASSESSMENT:  CLINICAL IMPRESSION: Pt  reports she still has pain 6-8/10 during work but that the pain decreases faster. She still has significant pain during work. We discussed dry needling and a possible trial next visit  OBJECTIVE IMPAIRMENTS: decreased activity tolerance, difficulty walking, decreased ROM, decreased strength, and pain.     GOALS: Goals reviewed with patient? Yes  SHORT TERM GOALS: Target date: 06/30/2022   Pt will be independent with initial HEP Baseline: Goal status: MET    LONG TERM GOALS: Target date: 07/28/2022    Pt will be independent with advanced HEP Baseline:  Goal status: INITIAL  2.  Pt will improve bilat ankle strength to 5/5 to be able work with decreased pain Baseline:  Goal status: INITIAL  3.  Pt will tolerate working 12 hour shift with foot/ankle pain <= 2/10 Baseline:  Goal status: INITIAL    PLAN:  PT FREQUENCY: 1-2x/week  PT DURATION: 6 weeks  PLANNED INTERVENTIONS: Therapeutic exercises, Therapeutic activity, Neuromuscular re-education, Balance training, Gait training, Patient/Family education, Self Care, Joint mobilization, Dry Needling, Electrical stimulation, Cryotherapy, Moist heat, Taping, Vasopneumatic device, Ultrasound, Ionotophoresis 4mg /ml Dexamethasone, Manual therapy, and Re-evaluation  PLAN FOR NEXT SESSION:  needling? Check goals, foot and ankle strength, balance/coordination, manual as indicated   Sieara Bremer, PT 07/19/2022, 4:12 PM

## 2022-07-21 ENCOUNTER — Ambulatory Visit: Payer: Commercial Managed Care - PPO | Admitting: Physical Therapy

## 2022-07-28 ENCOUNTER — Ambulatory Visit: Payer: Commercial Managed Care - PPO | Admitting: Physical Therapy

## 2022-07-28 ENCOUNTER — Encounter: Payer: Self-pay | Admitting: Physical Therapy

## 2022-07-28 DIAGNOSIS — M76821 Posterior tibial tendinitis, right leg: Secondary | ICD-10-CM

## 2022-07-28 DIAGNOSIS — M722 Plantar fascial fibromatosis: Secondary | ICD-10-CM

## 2022-07-28 DIAGNOSIS — M76822 Posterior tibial tendinitis, left leg: Secondary | ICD-10-CM | POA: Diagnosis not present

## 2022-07-28 NOTE — Therapy (Signed)
OUTPATIENT PHYSICAL THERAPY LOWER EXTREMITY TREATMENT AND RE-CERT   Patient Name: Elizabeth Perkins MRN: [id number] DOB:1986/01/17, 37 y.o., female Today's Date: 07/28/2022  END OF SESSION:  PT End of Session - 07/28/22 1315     Visit Number 6    Number of Visits 12    Date for PT Re-Evaluation 09/08/22    PT Start Time 1315    PT Stop Time 1400    PT Time Calculation (min) 45 min    Activity Tolerance Patient tolerated treatment well    Behavior During Therapy [facility] for tasks assessed/performed               Past Medical History:  Diagnosis Date   Abnormal Pap smear of cervix    HGSIL 2019 Never followed up   Cystic fibrosis gene carrier 04/14/2020   FOB has negative CF testing.    HGSIL (high grade squamous intraepithelial lesion) on Pap smear of cervix 12/15/2016   Formatting of this note is different from the original.  ASCUS/HPV+ on 12-02-16  02/2017: CIN 1  Needs cotesting in 11/2017   HPV in female    Plantar fasciitis, right    Vaginal Pap smear, abnormal    ASCUS   Vitamin D deficiency    Past Surgical History:  Procedure Laterality Date   CESAREAN SECTION N/A 10/03/2020   Procedure: CESAREAN SECTION;  Surgeon: Warden Fillers, MD;  Location: [facility] LD ORS;  Service: Obstetrics;  Laterality: N/A;   COLPOSCOPY     There are no problems to display for this patient.   PCP: Juliene Pina  REFERRING PROVIDER: Louann Sjogren  REFERRING DIAG: bilat posterior tibial tendon dysfunction, bilat plantar fasciitis  THERAPY DIAG:  Posterior tibial tendon dysfunction (PTTD) of right lower extremity  Posterior tibial tendon dysfunction (PTTD) of left lower extremity  Bilateral plantar fasciitis  Rationale for Evaluation and Treatment: Rehabilitation  ONSET DATE: 05/2022  SUBJECTIVE:   SUBJECTIVE STATEMENT: Pt states she has been having a hard time getting accommodations at work. Pt with plans to switch department June 30. "I'm still hurting but not as bad." Has  been using the braces at home.  PERTINENT HISTORY: Plantar fasciitis 2022 Pt has been having Rt arch and medial ankle pain worsening for the past few months, she states the pain is beginning to start on her Lt foot too. Pain increases with prolonged walking and standing. She wears a brace PRN on Lt foot, at all times when out of bed on Rt foot. Pain decreases with stretching, ice and rest PAIN:  Are you having pain? Yes: NPRS scale: 5 or 6/10 currently in Rt arch, up to 7-8/10 with prolonged standing and walking/10 Pain location: Rt arch Pain description: sore, tight Aggravating factors: prolonged standing and walking Relieving factors: rest, ice  PRECAUTIONS: None  WEIGHT BEARING RESTRICTIONS: No  FALLS:  Has patient fallen in last 6 months? 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: Independent  PATIENT GOALS: work with decreased pain  NEXT MD VISIT: 07/29/22  OBJECTIVE:    PALPATION: TTP Rt plantar fascia, Rt and Lt posterior tibialis tendons TC and metatarsal jt mobility WFL bilat  LOWER EXTREMITY ROM:  MMT Right eval Left eval Right 07/28/22 Left 07/28/22  Hip flexion      Hip extension      Hip abduction      Hip adduction      Hip internal rotation      Hip external rotation  Knee flexion      Knee extension      Ankle dorsiflexion 4 4+ 4+ * 5  Ankle plantarflexion 4 pain 4 Unable to perform SL heel raise 5  Ankle inversion 4+ 4 4 5   Ankle eversion 4 4+ 4+ 5   (Blank rows = not tested)  LOWER EXTREMITY ROM:  AROM Right eval Left eval Right 07/28/22 Left 07/28/22  Hip flexion      Hip extension      Hip abduction      Hip adduction      Hip internal rotation      Hip external rotation      Knee flexion      Knee extension      Ankle dorsiflexion 4 6 15 18   Ankle plantarflexion 50 55    Ankle inversion 18 20    Ankle eversion 22 pain 32 30 pain 35   (Blank rows = not tested)   FUNCTIONAL TESTS:  No pain with SLS  bilat    TODAY'S TREATMENT:       [facility] Adult PT Treatment:                                                DATE: 07/28/22 Therapeutic Exercise: Recumbent L3 x 5 min Kneeling toe flexor stretch 2x30 sec Kneeling toe extensor stretch 2x30 sec Toe flexion towel with 2# 2x10 Toe abd/add 2x10 Arch lift sit<>stand 2x10 Manual Therapy: IASTM, STM & TPR posterior tib and quadratus plantae Therapeutic Activity: Rechecking goals       [facility] Adult PT Treatment:                                                DATE: 07/19/22 Therapeutic Exercise: Recumbent L3 x 5 min Heel raises toes straight, in, out x 20 each Woodpecker x 10 bilat Forward lunge onto bosu x 10 bilat SL pallof green TB x 10 bilat Standing arch lift x 10 Toe yoga x 10 bilat Manual Therapy: STM bilat heel, plantar fascia, gastroc/soleus   [facility] Adult PT Treatment:                                                DATE: 07/13/22 Therapeutic Exercise: Recumbent L3 x 5 min for warm up Gastroc stretch 2 x 30 sec Soleus stretch 2 x 30 sec Toe yoga x 10 Arch lifting x 10 Towel scrunch x 1 min Heel raise with ball squeeze between heels x 20 Tandem stance on foam 2 x 30 sec SLS x 30 sec bilat Woodpecker x 10 bilat Sidestep with red TB around feet 4 x 10' Manual Therapy: STM bilat heel, plantar fascia, gastroc/soleus    PATIENT EDUCATION:  Education details: PT POC and goals, HEP Person educated: Patient Education method: Explanation, Demonstration, and Handouts Education comprehension: verbalized understanding and returned demonstration  HOME EXERCISE PROGRAM: Access Code: [email or web address] URL: [email or web address] Date: 06/23/2022 Prepared by: Reggy Eye  Exercises - Ankle Inversion with Resistance  - 1 x daily - 7 x weekly - 3 sets - 10 reps - Ankle Eversion with  Resistance  - 1 x daily - 7 x weekly - 3 sets - 10 reps - Toe Yoga - Alternating Great Toe and Lesser Toe Extension  - 1 x daily - 7 x weekly - 1  sets - 10 reps - Seated Heel Raise  - 1 x daily - 7 x weekly - 3 sets - 10 reps - Gastroc Stretch on Wall  - 1 x daily - 7 x weekly - 1 sets - 3 reps - 20-30 sec hold - Soleus Stretch on Wall  - 1 x daily - 7 x weekly - 1 sets - 3 reps - 20-30 sec hold - Heel Raises with Counter Support  - 1 x daily - 7 x weekly - 3 sets - 10 reps - Standing Heel Raise with Toes Turned Out  - 1 x daily - 7 x weekly - 3 sets - 10 reps - Side Stepping with Resistance at Feet  - 1 x daily - 7 x weekly - 3 sets - 10 reps - Single Leg Balance with Clock Reach  - 1 x daily - 7 x weekly - 3 sets - 10 reps  ASSESSMENT:  CLINICAL IMPRESSION: Pt reports improvements but with increased walking and time spent on her feet she continues to feel the pain. She does feel pain is better managed now. Pt is demosntrating improving ROM and strength but not yet fully to goal level. Pt is noting more pain around closer to the plantar surface of her calcaneus -- focused on quadratus plantae STM today with good reprieve of symptoms. Pt would benefit from continued PT to continue to work towards her goals. Pt is still not strong enough to tolerate single leg heel raise on the right without pain. Will be beneficial to see how she performs when she switches to a job with less walking.   OBJECTIVE IMPAIRMENTS: decreased activity tolerance, difficulty walking, decreased ROM, decreased strength, and pain.     GOALS: Goals reviewed with patient? Yes  SHORT TERM GOALS: Target date: 06/30/2022   Pt will be independent with initial HEP Baseline: Goal status: MET    LONG TERM GOALS: Target date: 09/08/2022   Pt will be independent with advanced HEP Baseline:  Goal status: MET  2.  Pt will improve bilat ankle strength to 5/5 to be able work with decreased pain Baseline:  07/28/22: see MMT above Goal status: IN PROGRESS   3.  Pt will tolerate working 12 hour shift with foot/ankle pain <= 2/10 Baseline:  07/28/22: depending on how  busy 7-10/10 at worst; when paired with someone 5 or 6/10 Goal status: IN PROGRESS   4.  Pt will be able to perform SL heel raise on the right to demo increased ankle/foot strength and stability Baseline:  Goal status: INITIAL  PLAN:  PT FREQUENCY: 1-2x/week  PT DURATION: 6 weeks  PLANNED INTERVENTIONS: Therapeutic exercises, Therapeutic activity, Neuromuscular re-education, Balance training, Gait training, Patient/Family education, Self Care, Joint mobilization, Dry Needling, Electrical stimulation, Cryotherapy, Moist heat, Taping, Vasopneumatic device, Ultrasound, Ionotophoresis 4mg /ml Dexamethasone, Manual therapy, and Re-evaluation  PLAN FOR NEXT SESSION:  needling? Check goals, foot and ankle strength, balance/coordination, manual as indicated   Millicent Blazejewski April Ma L [place], PT 07/28/2022, 4:21 PM

## 2022-07-29 ENCOUNTER — Encounter: Payer: Self-pay | Admitting: Podiatry

## 2022-07-29 ENCOUNTER — Ambulatory Visit: Payer: Commercial Managed Care - PPO | Admitting: Podiatry

## 2022-07-29 DIAGNOSIS — M722 Plantar fascial fibromatosis: Secondary | ICD-10-CM

## 2022-07-29 DIAGNOSIS — M76821 Posterior tibial tendinitis, right leg: Secondary | ICD-10-CM

## 2022-07-29 DIAGNOSIS — M76822 Posterior tibial tendinitis, left leg: Secondary | ICD-10-CM

## 2022-07-29 NOTE — Progress Notes (Signed)
  Subjective:  Patient ID: Elizabeth Perkins, female    DOB: 08-27-1985,   MRN: [id number]  Chief Complaint  Patient presents with   Foot Pain    Bilateral foot pain still improving , patient states she is still going to PT and they would like to keep her for another month since she will be switching departments at her job     37 y.o. female presents for follow-up of right PTTD. Relates she is doing better and PT is helping. Relates she is also switching jobs and hoping that will help to with accomadations  Unfortunately no longer pregnant but will be trying again. Denies any other pedal complaints. Denies n/v/f/c.   Past Medical History:  Diagnosis Date   Abnormal Pap smear of cervix    HGSIL 2019 Never followed up   Cystic fibrosis gene carrier 04/14/2020   FOB has negative CF testing.    HGSIL (high grade squamous intraepithelial lesion) on Pap smear of cervix 12/15/2016   Formatting of this note is different from the original.  ASCUS/HPV+ on 12-02-16  02/2017: CIN 1  Needs cotesting in 11/2017   HPV in female    Plantar fasciitis, right    Vaginal Pap smear, abnormal    ASCUS   Vitamin D deficiency     Objective:  Physical Exam: Vascular: DP/PT pulses 2/4 bilateral. CFT <3 seconds. Normal hair growth on digits. No edema.  Skin. No lacerations or abrasions bilateral feet.  Musculoskeletal: MMT 5/5 bilateral lower extremities in DF, PF, Inversion and Eversion. Deceased ROM in DF of ankle joint. Mildly tender to insertion of PT tendon and proximally along tendon. Pain with inversion. Some pain at medial calcaneal tubercle. Mild pain on the left as well in PT insertion and calcaneal tuberosity but not as significant.  Neurological: Sensation intact to light touch.   Assessment:   1. Posterior tibial tendon dysfunction (PTTD) of right lower extremity   2. Posterior tibial tendon dysfunction (PTTD) of left lower extremity   3. Bilateral plantar fasciitis       Plan:  Patient was  evaluated and treated and all questions answered. X-rays reviewed and discussed with patient. No acute fractures or dislocations noted.  Discussed PTTD diagnosis and treatment options with patient.. Continue stretching and bracing.  Referral to PT sent.  Anti-inflammatories as needed  Work note provided to be off feet when need rest extended.  Discussed custom orthotics and information given if needed.  Discussed if there is no improvement PT/MRI/injection may be an option. Patient to return to clinic as needed.     Louann Sjogren, DPM

## 2022-08-05 ENCOUNTER — Ambulatory Visit: Payer: Commercial Managed Care - PPO | Admitting: Physical Therapy

## 2022-08-05 ENCOUNTER — Encounter: Payer: Self-pay | Admitting: Physical Therapy

## 2022-08-05 DIAGNOSIS — M76822 Posterior tibial tendinitis, left leg: Secondary | ICD-10-CM

## 2022-08-05 DIAGNOSIS — M722 Plantar fascial fibromatosis: Secondary | ICD-10-CM

## 2022-08-05 DIAGNOSIS — M76821 Posterior tibial tendinitis, right leg: Secondary | ICD-10-CM | POA: Diagnosis not present

## 2022-08-05 NOTE — Therapy (Signed)
OUTPATIENT PHYSICAL THERAPY LOWER EXTREMITY TREATMENT AND RE-CERT   Patient Name: Elizabeth Perkins MRN: [id number] DOB:Feb 22, 1985, 37 y.o., female Today's Date: 08/05/2022  END OF SESSION:  PT End of Session - 08/05/22 1616     Visit Number 7    Number of Visits 12    Date for PT Re-Evaluation 09/08/22    PT Start Time 1617    PT Stop Time 1700    PT Time Calculation (min) 43 min    Activity Tolerance Patient tolerated treatment well    Behavior During Therapy [facility] for tasks assessed/performed               Past Medical History:  Diagnosis Date   Abnormal Pap smear of cervix    HGSIL 2019 Never followed up   Cystic fibrosis gene carrier 04/14/2020   FOB has negative CF testing.    HGSIL (high grade squamous intraepithelial lesion) on Pap smear of cervix 12/15/2016   Formatting of this note is different from the original.  ASCUS/HPV+ on 12-02-16  02/2017: CIN 1  Needs cotesting in 11/2017   HPV in female    Plantar fasciitis, right    Vaginal Pap smear, abnormal    ASCUS   Vitamin D deficiency    Past Surgical History:  Procedure Laterality Date   CESAREAN SECTION N/A 10/03/2020   Procedure: CESAREAN SECTION;  Surgeon: Warden Fillers, MD;  Location: [facility] LD ORS;  Service: Obstetrics;  Laterality: N/A;   COLPOSCOPY     There are no problems to display for this patient.   PCP: Juliene Pina  REFERRING PROVIDER: Louann Sjogren  REFERRING DIAG: bilat posterior tibial tendon dysfunction, bilat plantar fasciitis  THERAPY DIAG:  Posterior tibial tendon dysfunction (PTTD) of right lower extremity  Posterior tibial tendon dysfunction (PTTD) of left lower extremity  Bilateral plantar fasciitis  Rationale for Evaluation and Treatment: Rehabilitation  ONSET DATE: 05/2022  SUBJECTIVE:   SUBJECTIVE STATEMENT: Pt notes nothing new or different. "It's not feeling too bad." Pt states she's been doing self massage through her quadratus plantae which has  helped.  PERTINENT HISTORY: Plantar fasciitis 2022 Pt has been having Rt arch and medial ankle pain worsening for the past few months, she states the pain is beginning to start on her Lt foot too. Pain increases with prolonged walking and standing. She wears a brace PRN on Lt foot, at all times when out of bed on Rt foot. Pain decreases with stretching, ice and rest PAIN:  Are you having pain? Yes: NPRS scale: 5 or 6/10 currently in Rt arch, up to 7-8/10 with prolonged standing and walking/10 Pain location: Rt arch Pain description: sore, tight Aggravating factors: prolonged standing and walking Relieving factors: rest, ice  PRECAUTIONS: None  WEIGHT BEARING RESTRICTIONS: No  FALLS:  Has patient fallen in last 6 months? 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: Independent  PATIENT GOALS: work with decreased pain  NEXT MD VISIT: 07/29/22  OBJECTIVE:    PALPATION: TTP Rt plantar fascia, Rt and Lt posterior tibialis tendons TC and metatarsal jt mobility WFL bilat  LOWER EXTREMITY ROM:  MMT Right eval Left eval Right 07/28/22 Left 07/28/22  Hip flexion      Hip extension      Hip abduction      Hip adduction      Hip internal rotation      Hip external rotation      Knee flexion      Knee extension  Ankle dorsiflexion 4 4+ 4+ * 5  Ankle plantarflexion 4 pain 4 Unable to perform SL heel raise 5  Ankle inversion 4+ 4 4 5   Ankle eversion 4 4+ 4+ 5   (Blank rows = not tested)  LOWER EXTREMITY ROM:  AROM Right eval Left eval Right 07/28/22 Left 07/28/22  Hip flexion      Hip extension      Hip abduction      Hip adduction      Hip internal rotation      Hip external rotation      Knee flexion      Knee extension      Ankle dorsiflexion 4 6 15 18   Ankle plantarflexion 50 55    Ankle inversion 18 20    Ankle eversion 22 pain 32 30 pain 35   (Blank rows = not tested)   FUNCTIONAL TESTS:  No pain with SLS bilat    TODAY'S TREATMENT:        [facility] Adult PT Treatment:                                                DATE: 08/05/22 Therapeutic Exercise: Recumbent L3 x 5 min Kneeling toe flexor stretch 2x30 sec Kneeling toe extensor stretch 2x30 sec Gastroc stretch x 30 sec Soleus stretch x 30 sec Plantar flexion into toe flexion green TB 2x10 Dorsiflexion into toe flexion green TB 2x10  Eccentric heel raise R & L 2x10 Arch lifting with foot on yoga block (one under calcaneus, one under forefoot) 2x3x10 sec Standing arch lifting with toe extension x10   [facility] Adult PT Treatment:                                                DATE: 07/28/22 Therapeutic Exercise: Recumbent L3 x 5 min Kneeling toe flexor stretch 2x30 sec Kneeling toe extensor stretch 2x30 sec Toe flexion towel with 2# 2x10 Toe abd/add 2x10 Arch lift sit<>stand 2x10 Manual Therapy: IASTM, STM & TPR posterior tib and quadratus plantae Therapeutic Activity: Rechecking goals       [facility] Adult PT Treatment:                                                DATE: 07/19/22 Therapeutic Exercise: Recumbent L3 x 5 min Heel raises toes straight, in, out x 20 each Woodpecker x 10 bilat Forward lunge onto bosu x 10 bilat SL pallof green TB x 10 bilat Standing arch lift x 10 Toe yoga x 10 bilat Manual Therapy: STM bilat heel, plantar fascia, gastroc/soleus     PATIENT EDUCATION:  Education details: PT POC and goals, HEP Person educated: Patient Education method: Explanation, Demonstration, and Handouts Education comprehension: verbalized understanding and returned demonstration  HOME EXERCISE PROGRAM: Access Code: [email or web address] URL: [email or web address] Date: 06/23/2022 Prepared by: Reggy Eye  Exercises - Ankle Inversion with Resistance  - 1 x daily - 7 x weekly - 3 sets - 10 reps - Ankle Eversion with Resistance  - 1 x daily - 7 x weekly - 3 sets - 10 reps -  Toe Yoga - Alternating Great Toe and Lesser Toe Extension  - 1 x daily - 7 x weekly -  1 sets - 10 reps - Seated Heel Raise  - 1 x daily - 7 x weekly - 3 sets - 10 reps - Gastroc Stretch on Wall  - 1 x daily - 7 x weekly - 1 sets - 3 reps - 20-30 sec hold - Soleus Stretch on Wall  - 1 x daily - 7 x weekly - 1 sets - 3 reps - 20-30 sec hold - Heel Raises with Counter Support  - 1 x daily - 7 x weekly - 3 sets - 10 reps - Standing Heel Raise with Toes Turned Out  - 1 x daily - 7 x weekly - 3 sets - 10 reps - Side Stepping with Resistance at Feet  - 1 x daily - 7 x weekly - 3 sets - 10 reps - Single Leg Balance with Clock Reach  - 1 x daily - 7 x weekly - 3 sets - 10 reps  ASSESSMENT:  CLINICAL IMPRESSION: Continued to progress intrinsic foot strengthening as tolerated. Pt now tolerating eccentric heel raises (still has increased pain when attempting single leg heel raises).   OBJECTIVE IMPAIRMENTS: decreased activity tolerance, difficulty walking, decreased ROM, decreased strength, and pain.     GOALS: Goals reviewed with patient? Yes  SHORT TERM GOALS: Target date: 06/30/2022   Pt will be independent with initial HEP Baseline: Goal status: MET    LONG TERM GOALS: Target date: 09/08/2022   Pt will be independent with advanced HEP Baseline:  Goal status: MET  2.  Pt will improve bilat ankle strength to 5/5 to be able work with decreased pain Baseline:  07/28/22: see MMT above Goal status: IN PROGRESS   3.  Pt will tolerate working 12 hour shift with foot/ankle pain <= 2/10 Baseline:  07/28/22: depending on how busy 7-10/10 at worst; when paired with someone 5 or 6/10 Goal status: IN PROGRESS   4.  Pt will be able to perform SL heel raise on the right to demo increased ankle/foot strength and stability Baseline:  Goal status: INITIAL  PLAN:  PT FREQUENCY: 1-2x/week  PT DURATION: 6 weeks  PLANNED INTERVENTIONS: Therapeutic exercises, Therapeutic activity, Neuromuscular re-education, Balance training, Gait training, Patient/Family education, Self Care,  Joint mobilization, Dry Needling, Electrical stimulation, Cryotherapy, Moist heat, Taping, Vasopneumatic device, Ultrasound, Ionotophoresis 4mg /ml Dexamethasone, Manual therapy, and Re-evaluation  PLAN FOR NEXT SESSION:  needling? Check goals, foot and ankle strength, balance/coordination, manual as indicated   Siedah Sedor April Ma L Jenayah Antu, PT 08/05/2022, 4:17 PM

## 2022-08-11 ENCOUNTER — Encounter: Payer: Self-pay | Admitting: Physical Therapy

## 2022-08-11 ENCOUNTER — Ambulatory Visit: Payer: Commercial Managed Care - PPO | Admitting: Physical Therapy

## 2022-08-11 DIAGNOSIS — M76822 Posterior tibial tendinitis, left leg: Secondary | ICD-10-CM

## 2022-08-11 DIAGNOSIS — M76821 Posterior tibial tendinitis, right leg: Secondary | ICD-10-CM

## 2022-08-11 DIAGNOSIS — M722 Plantar fascial fibromatosis: Secondary | ICD-10-CM

## 2022-08-11 NOTE — Therapy (Signed)
OUTPATIENT PHYSICAL THERAPY LOWER EXTREMITY TREATMENT   Patient Name: Elizabeth Perkins MRN: [id number] DOB:11-09-1985, 37 y.o., female Today's Date: 08/11/2022  END OF SESSION:  PT End of Session - 08/11/22 1439     Visit Number 8    Number of Visits 12    Date for PT Re-Evaluation 09/08/22    PT Start Time 1400    PT Stop Time 1439    PT Time Calculation (min) 39 min    Activity Tolerance Patient tolerated treatment well    Behavior During Therapy [facility] for tasks assessed/performed                Past Medical History:  Diagnosis Date   Abnormal Pap smear of cervix    HGSIL 2019 Never followed up   Cystic fibrosis gene carrier 04/14/2020   FOB has negative CF testing.    HGSIL (high grade squamous intraepithelial lesion) on Pap smear of cervix 12/15/2016   Formatting of this note is different from the original.  ASCUS/HPV+ on 12-02-16  02/2017: CIN 1  Needs cotesting in 11/2017   HPV in female    Plantar fasciitis, right    Vaginal Pap smear, abnormal    ASCUS   Vitamin D deficiency    Past Surgical History:  Procedure Laterality Date   CESAREAN SECTION N/A 10/03/2020   Procedure: CESAREAN SECTION;  Surgeon: Warden Fillers, MD;  Location: [facility] LD ORS;  Service: Obstetrics;  Laterality: N/A;   COLPOSCOPY     There are no problems to display for this patient.   PCP: Juliene Pina  REFERRING PROVIDER: Louann Sjogren  REFERRING DIAG: bilat posterior tibial tendon dysfunction, bilat plantar fasciitis  THERAPY DIAG:  Posterior tibial tendon dysfunction (PTTD) of right lower extremity  Bilateral plantar fasciitis  Posterior tibial tendon dysfunction (PTTD) of left lower extremity  Rationale for Evaluation and Treatment: Rehabilitation  ONSET DATE: 05/2022  SUBJECTIVE:   SUBJECTIVE STATEMENT: Pt states "I am recovering from a lot of work this weekend". She starts her new job this coming week  PERTINENT HISTORY: Plantar fasciitis 2022 Pt has been having Rt  arch and medial ankle pain worsening for the past few months, she states the pain is beginning to start on her Lt foot too. Pain increases with prolonged walking and standing. She wears a brace PRN on Lt foot, at all times when out of bed on Rt foot. Pain decreases with stretching, ice and rest PAIN:  Are you having pain? Yes: NPRS scale: 5 or 6/10 currently in Rt arch, up to 7-8/10 with prolonged standing and walking/10 Pain location: Rt arch Pain description: sore, tight Aggravating factors: prolonged standing and walking Relieving factors: rest, ice  PRECAUTIONS: None  WEIGHT BEARING RESTRICTIONS: No  FALLS:  Has patient fallen in last 6 months? 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: Independent  PATIENT GOALS: work with decreased pain  NEXT MD VISIT: 07/29/22  OBJECTIVE:    PALPATION: TTP Rt plantar fascia, Rt and Lt posterior tibialis tendons TC and metatarsal jt mobility WFL bilat  LOWER EXTREMITY ROM:  MMT Right eval Left eval Right 07/28/22 Left 07/28/22  Hip flexion      Hip extension      Hip abduction      Hip adduction      Hip internal rotation      Hip external rotation      Knee flexion      Knee extension      Ankle dorsiflexion  4 4+ 4+ * 5  Ankle plantarflexion 4 pain 4 Unable to perform SL heel raise 5  Ankle inversion 4+ 4 4 5   Ankle eversion 4 4+ 4+ 5   (Blank rows = not tested)  LOWER EXTREMITY ROM:  AROM Right eval Left eval Right 07/28/22 Left 07/28/22  Hip flexion      Hip extension      Hip abduction      Hip adduction      Hip internal rotation      Hip external rotation      Knee flexion      Knee extension      Ankle dorsiflexion 4 6 15 18   Ankle plantarflexion 50 55    Ankle inversion 18 20    Ankle eversion 22 pain 32 30 pain 35   (Blank rows = not tested)   TODAY'S TREATMENT:       [facility] Adult PT Treatment:                                                DATE: 08/11/22 Therapeutic Exercise: Recumbent L4 x 5  min for warm up Kneeling toe flexor stretch 2x30 sec Kneeling toe extensor stretch 2x30 sec Plantar flexion into toe flexion green TB 2x10 Dorsiflexion into toe extension green TB 2x10  Eccentric heel raise on step 2 x 10 Standing arch lifting with toe ext x 10 bilat Seated toe splaying x 10 bilat Standing balance blue side of BOSU 2 x 30 sec bilat   [facility] Adult PT Treatment:                                                DATE: 08/05/22 Therapeutic Exercise: Recumbent L3 x 5 min Kneeling toe flexor stretch 2x30 sec Kneeling toe extensor stretch 2x30 sec Gastroc stretch x 30 sec Soleus stretch x 30 sec Plantar flexion into toe flexion green TB 2x10 Dorsiflexion into toe flexion green TB 2x10  Eccentric heel raise R & L 2x10 Arch lifting with foot on yoga block (one under calcaneus, one under forefoot) 2x3x10 sec Standing arch lifting with toe extension x10   [facility] Adult PT Treatment:                                                DATE: 07/28/22 Therapeutic Exercise: Recumbent L3 x 5 min Kneeling toe flexor stretch 2x30 sec Kneeling toe extensor stretch 2x30 sec Toe flexion towel with 2# 2x10 Toe abd/add 2x10 Arch lift sit<>stand 2x10 Manual Therapy: IASTM, STM & TPR posterior tib and quadratus plantae Therapeutic Activity: Rechecking goals   PATIENT EDUCATION:  Education details: PT POC and goals, HEP Person educated: Patient Education method: Explanation, Demonstration, and Handouts Education comprehension: verbalized understanding and returned demonstration  HOME EXERCISE PROGRAM: Access Code: [email or web address] URL: [email or web address] Date: 06/23/2022 Prepared by: Reggy Eye  Exercises - Ankle Inversion with Resistance  - 1 x daily - 7 x weekly - 3 sets - 10 reps - Ankle Eversion with Resistance  - 1 x daily - 7 x weekly - 3 sets - 10 reps -  Toe Yoga - Alternating Great Toe and Lesser Toe Extension  - 1 x daily - 7 x weekly - 1 sets - 10 reps - Seated  Heel Raise  - 1 x daily - 7 x weekly - 3 sets - 10 reps - Gastroc Stretch on Wall  - 1 x daily - 7 x weekly - 1 sets - 3 reps - 20-30 sec hold - Soleus Stretch on Wall  - 1 x daily - 7 x weekly - 1 sets - 3 reps - 20-30 sec hold - Heel Raises with Counter Support  - 1 x daily - 7 x weekly - 3 sets - 10 reps - Standing Heel Raise with Toes Turned Out  - 1 x daily - 7 x weekly - 3 sets - 10 reps - Side Stepping with Resistance at Feet  - 1 x daily - 7 x weekly - 3 sets - 10 reps - Single Leg Balance with Clock Reach  - 1 x daily - 7 x weekly - 3 sets - 10 reps  ASSESSMENT:  CLINICAL IMPRESSION: Continued to progress foot strength and balance. Pt is progressing well with improved activity tolerance noted this visit  OBJECTIVE IMPAIRMENTS: decreased activity tolerance, difficulty walking, decreased ROM, decreased strength, and pain.     GOALS: Goals reviewed with patient? Yes  SHORT TERM GOALS: Target date: 06/30/2022   Pt will be independent with initial HEP Baseline: Goal status: MET    LONG TERM GOALS: Target date: 09/08/2022   Pt will be independent with advanced HEP Baseline:  Goal status: MET  2.  Pt will improve bilat ankle strength to 5/5 to be able work with decreased pain Baseline:  07/28/22: see MMT above Goal status: IN PROGRESS   3.  Pt will tolerate working 12 hour shift with foot/ankle pain <= 2/10 Baseline:  07/28/22: depending on how busy 7-10/10 at worst; when paired with someone 5 or 6/10 Goal status: IN PROGRESS   4.  Pt will be able to perform SL heel raise on the right to demo increased ankle/foot strength and stability Baseline:  Goal status: INITIAL  PLAN:  PT FREQUENCY: 1-2x/week  PT DURATION: 6 weeks  PLANNED INTERVENTIONS: Therapeutic exercises, Therapeutic activity, Neuromuscular re-education, Balance training, Gait training, Patient/Family education, Self Care, Joint mobilization, Dry Needling, Electrical stimulation, Cryotherapy, Moist  heat, Taping, Vasopneumatic device, Ultrasound, Ionotophoresis 4mg /ml Dexamethasone, Manual therapy, and Re-evaluation  PLAN FOR NEXT SESSION:  foot and ankle strength, balance/coordination, manual as indicated   Aline Wesche, PT 08/11/2022, 2:39 PM

## 2022-08-23 ENCOUNTER — Ambulatory Visit: Payer: Commercial Managed Care - PPO | Attending: Podiatry

## 2022-08-23 DIAGNOSIS — M76821 Posterior tibial tendinitis, right leg: Secondary | ICD-10-CM | POA: Insufficient documentation

## 2022-08-23 DIAGNOSIS — M76822 Posterior tibial tendinitis, left leg: Secondary | ICD-10-CM | POA: Insufficient documentation

## 2022-08-23 DIAGNOSIS — M722 Plantar fascial fibromatosis: Secondary | ICD-10-CM | POA: Diagnosis not present

## 2022-08-23 NOTE — Therapy (Signed)
OUTPATIENT PHYSICAL THERAPY LOWER EXTREMITY TREATMENT   Patient Name: Elizabeth Perkins MRN: [id number] DOB:1985-02-24, 37 y.o., female Today's Date: 08/23/2022  END OF SESSION:  PT End of Session - 08/23/22 1532     Visit Number 9    Number of Visits 12    Date for PT Re-Evaluation 09/08/22    PT Start Time 1532    PT Stop Time 1612    PT Time Calculation (min) 40 min    Activity Tolerance Patient tolerated treatment well    Behavior During Therapy [facility] for tasks assessed/performed                Past Medical History:  Diagnosis Date   Abnormal Pap smear of cervix    HGSIL 2019 Never followed up   Cystic fibrosis gene carrier 04/14/2020   FOB has negative CF testing.    HGSIL (high grade squamous intraepithelial lesion) on Pap smear of cervix 12/15/2016   Formatting of this note is different from the original.  ASCUS/HPV+ on 12-02-16  02/2017: CIN 1  Needs cotesting in 11/2017   HPV in female    Plantar fasciitis, right    Vaginal Pap smear, abnormal    ASCUS   Vitamin D deficiency    Past Surgical History:  Procedure Laterality Date   CESAREAN SECTION N/A 10/03/2020   Procedure: CESAREAN SECTION;  Surgeon: Warden Fillers, MD;  Location: [facility] LD ORS;  Service: Obstetrics;  Laterality: N/A;   COLPOSCOPY     There are no problems to display for this patient.   PCP: Juliene Pina  REFERRING PROVIDER: Louann Sjogren  REFERRING DIAG: bilat posterior tibial tendon dysfunction, bilat plantar fasciitis  THERAPY DIAG:  Posterior tibial tendon dysfunction (PTTD) of right lower extremity  Bilateral plantar fasciitis  Posterior tibial tendon dysfunction (PTTD) of left lower extremity  Rationale for Evaluation and Treatment: Rehabilitation  ONSET DATE: 05/2022  SUBJECTIVE:   SUBJECTIVE STATEMENT: Patient reports her new job is much better with less walking. Patient states she has mild soreness today in both heels.   PERTINENT HISTORY: Plantar fasciitis 2022 Pt  has been having Rt arch and medial ankle pain worsening for the past few months, she states the pain is beginning to start on her Lt foot too. Pain increases with prolonged walking and standing. She wears a brace PRN on Lt foot, at all times when out of bed on Rt foot. Pain decreases with stretching, ice and rest PAIN:  Are you having pain? Yes: NPRS scale: 5 or 6/10 currently in Rt arch, up to 7-8/10 with prolonged standing and walking/10 Pain location: Rt arch Pain description: sore, tight Aggravating factors: prolonged standing and walking Relieving factors: rest, ice  PRECAUTIONS: None  WEIGHT BEARING RESTRICTIONS: No  FALLS:  Has patient fallen in last 6 months? 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: Independent  PATIENT GOALS: work with decreased pain  NEXT MD VISIT: 07/29/22  OBJECTIVE:    PALPATION: TTP Rt plantar fascia, Rt and Lt posterior tibialis tendons TC and metatarsal jt mobility WFL bilat  LOWER EXTREMITY ROM:  MMT Right eval Left eval Right 07/28/22 Left 07/28/22  Hip flexion      Hip extension      Hip abduction      Hip adduction      Hip internal rotation      Hip external rotation      Knee flexion      Knee extension  Ankle dorsiflexion 4 4+ 4+ * 5  Ankle plantarflexion 4 pain 4 Unable to perform SL heel raise 5  Ankle inversion 4+ 4 4 5   Ankle eversion 4 4+ 4+ 5   (Blank rows = not tested)  LOWER EXTREMITY ROM:  AROM Right eval Left eval Right 07/28/22 Left 07/28/22  Hip flexion      Hip extension      Hip abduction      Hip adduction      Hip internal rotation      Hip external rotation      Knee flexion      Knee extension      Ankle dorsiflexion 4 6 15 18   Ankle plantarflexion 50 55    Ankle inversion 18 20    Ankle eversion 22 pain 32 30 pain 35   (Blank rows = not tested)   TODAY'S TREATMENT:       [facility] Adult PT Treatment:                                                DATE: 08/23/2022 Therapeutic  Exercise: Recumbent bike L3 x 5 min Seated: Toe yoga  Towel scrunch + toe splay (alt)  Ankle circles + fingers threaded through toes Resisted ankle circles (ball of foot anchored on floor) --> added RTB around ankle (band anchored around chair legs) Toe extension over edge of yoga block x15 Soleus press 0# x 10 --> 15#KB resting on knee x10 (B) Standing on 2 yoga blocks, arch unsupported 3x30" each LE  Runner's lunge: gastroc/soleus stretches with toe extension on towel roll 2x30" each (B) Eccentric single leg heel lowers x10 B    [facility] Adult PT Treatment:                                                DATE: 08/11/22 Therapeutic Exercise: Recumbent L4 x 5 min for warm up Kneeling toe flexor stretch 2x30 sec Kneeling toe extensor stretch 2x30 sec Plantar flexion into toe flexion green TB 2x10 Dorsiflexion into toe extension green TB 2x10  Eccentric heel raise on step 2 x 10 Standing arch lifting with toe ext x 10 bilat Seated toe splaying x 10 bilat Standing balance blue side of BOSU 2 x 30 sec bilat   [facility] Adult PT Treatment:                                                DATE: 08/05/22 Therapeutic Exercise: Recumbent L3 x 5 min Kneeling toe flexor stretch 2x30 sec Kneeling toe extensor stretch 2x30 sec Gastroc stretch x 30 sec Soleus stretch x 30 sec Plantar flexion into toe flexion green TB 2x10 Dorsiflexion into toe flexion green TB 2x10  Eccentric heel raise R & L 2x10 Arch lifting with foot on yoga block (one under calcaneus, one under forefoot) 2x3x10 sec Standing arch lifting with toe extension x10    PATIENT EDUCATION:  Education details: PT POC and goals, HEP Person educated: Patient Education method: Explanation, Facilities manager, and Handouts Education comprehension: verbalized understanding and returned demonstration  HOME EXERCISE PROGRAM:  Access Code: [email or web address] URL: [email or web address] Date: 06/23/2022 Prepared by: Reggy Eye  Exercises - Ankle Inversion with Resistance  - 1 x daily - 7 x weekly - 3 sets - 10 reps - Ankle Eversion with Resistance  - 1 x daily - 7 x weekly - 3 sets - 10 reps - Toe Yoga - Alternating Great Toe and Lesser Toe Extension  - 1 x daily - 7 x weekly - 1 sets - 10 reps - Seated Heel Raise  - 1 x daily - 7 x weekly - 3 sets - 10 reps - Gastroc Stretch on Wall  - 1 x daily - 7 x weekly - 1 sets - 3 reps - 20-30 sec hold - Soleus Stretch on Wall  - 1 x daily - 7 x weekly - 1 sets - 3 reps - 20-30 sec hold - Heel Raises with Counter Support  - 1 x daily - 7 x weekly - 3 sets - 10 reps - Standing Heel Raise with Toes Turned Out  - 1 x daily - 7 x weekly - 3 sets - 10 reps - Side Stepping with Resistance at Feet  - 1 x daily - 7 x weekly - 3 sets - 10 reps - Single Leg Balance with Clock Reach  - 1 x daily - 7 x weekly - 3 sets - 10 reps  ASSESSMENT:  CLINICAL IMPRESSION: Session focused on ankle mobility and strengthening exercises; single leg stability challenged with unsupported arch during balance exercise. Heel raises performed with eccentric focus; patient able to perform all exercises with no increase in symptoms or pain.    OBJECTIVE IMPAIRMENTS: decreased activity tolerance, difficulty walking, decreased ROM, decreased strength, and pain.     GOALS: Goals reviewed with patient? Yes  SHORT TERM GOALS: Target date: 06/30/2022  Pt will be independent with initial HEP Baseline: Goal status: MET    LONG TERM GOALS: Target date: 09/08/2022  Pt will be independent with advanced HEP Baseline:  Goal status: MET  2.  Pt will improve bilat ankle strength to 5/5 to be able work with decreased pain Baseline:  07/28/22: see MMT above Goal status: IN PROGRESS   3.  Pt will tolerate working 12 hour shift with foot/ankle pain <= 2/10 Baseline:  07/28/22: depending on how busy 7-10/10 at worst; when paired with someone 5 or 6/10 Goal status: IN PROGRESS   4.  Pt will be  able to perform SL heel raise on the right to demo increased ankle/foot strength and stability Baseline:  Goal status: INITIAL  PLAN:  PT FREQUENCY: 1-2x/week  PT DURATION: 6 weeks  PLANNED INTERVENTIONS: Therapeutic exercises, Therapeutic activity, Neuromuscular re-education, Balance training, Gait training, Patient/Family education, Self Care, Joint mobilization, Dry Needling, Electrical stimulation, Cryotherapy, Moist heat, Taping, Vasopneumatic device, Ultrasound, Ionotophoresis 4mg /ml Dexamethasone, Manual therapy, and Re-evaluation  PLAN FOR NEXT SESSION:  Progress foot and ankle strength, balance/coordination, manual as indicated   Sanjuana Mae, PTA 08/23/2022, 4:14 PM

## 2022-08-30 ENCOUNTER — Ambulatory Visit: Payer: Commercial Managed Care - PPO

## 2022-08-30 DIAGNOSIS — M76821 Posterior tibial tendinitis, right leg: Secondary | ICD-10-CM | POA: Diagnosis not present

## 2022-08-30 DIAGNOSIS — M76822 Posterior tibial tendinitis, left leg: Secondary | ICD-10-CM | POA: Diagnosis not present

## 2022-08-30 DIAGNOSIS — M722 Plantar fascial fibromatosis: Secondary | ICD-10-CM

## 2022-08-30 NOTE — Therapy (Signed)
OUTPATIENT PHYSICAL THERAPY LOWER EXTREMITY TREATMENT   Patient Name: Elizabeth Perkins MRN: [id number] DOB:September 27, 1985, 37 y.o., female Today's Date: 08/30/2022  END OF SESSION:  PT End of Session - 08/30/22 1406     Visit Number 10    Number of Visits 12    Date for PT Re-Evaluation 09/08/22    PT Start Time 1405    PT Stop Time 1445    PT Time Calculation (min) 40 min    Activity Tolerance Patient tolerated treatment well    Behavior During Therapy [facility] for tasks assessed/performed                Past Medical History:  Diagnosis Date   Abnormal Pap smear of cervix    HGSIL 2019 Never followed up   Cystic fibrosis gene carrier 04/14/2020   FOB has negative CF testing.    HGSIL (high grade squamous intraepithelial lesion) on Pap smear of cervix 12/15/2016   Formatting of this note is different from the original.  ASCUS/HPV+ on 12-02-16  02/2017: CIN 1  Needs cotesting in 11/2017   HPV in female    Plantar fasciitis, right    Vaginal Pap smear, abnormal    ASCUS   Vitamin D deficiency    Past Surgical History:  Procedure Laterality Date   CESAREAN SECTION N/A 10/03/2020   Procedure: CESAREAN SECTION;  Surgeon: Warden Fillers, MD;  Location: [facility] LD ORS;  Service: Obstetrics;  Laterality: N/A;   COLPOSCOPY     There are no problems to display for this patient.   PCP: Juliene Pina  REFERRING PROVIDER: Louann Sjogren  REFERRING DIAG: bilat posterior tibial tendon dysfunction, bilat plantar fasciitis  THERAPY DIAG:  Posterior tibial tendon dysfunction (PTTD) of right lower extremity  Bilateral plantar fasciitis  Posterior tibial tendon dysfunction (PTTD) of left lower extremity  Rationale for Evaluation and Treatment: Rehabilitation  ONSET DATE: 05/2022  SUBJECTIVE:   SUBJECTIVE STATEMENT: Patient reports the soreness in heels is much better since switching jobs; reports no pain in foot today.   PERTINENT HISTORY: Plantar fasciitis 2022 Pt has been  having Rt arch and medial ankle pain worsening for the past few months, she states the pain is beginning to start on her Lt foot too. Pain increases with prolonged walking and standing. She wears a brace PRN on Lt foot, at all times when out of bed on Rt foot. Pain decreases with stretching, ice and rest PAIN:  Are you having pain? Yes: NPRS scale: 5 or 6/10 currently in Rt arch, up to 7-8/10 with prolonged standing and walking/10 Pain location: Rt arch Pain description: sore, tight Aggravating factors: prolonged standing and walking Relieving factors: rest, ice  PRECAUTIONS: None  WEIGHT BEARING RESTRICTIONS: No  FALLS:  Has patient fallen in last 6 months? 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: Independent  PATIENT GOALS: work with decreased pain  NEXT MD VISIT: 07/29/22  OBJECTIVE:    PALPATION: TTP Rt plantar fascia, Rt and Lt posterior tibialis tendons TC and metatarsal jt mobility WFL bilat  LOWER EXTREMITY ROM:  MMT Right eval Left eval Right 07/28/22 Left 07/28/22  Hip flexion      Hip extension      Hip abduction      Hip adduction      Hip internal rotation      Hip external rotation      Knee flexion      Knee extension      Ankle dorsiflexion 4  4+ 4+ * 5  Ankle plantarflexion 4 pain 4 Unable to perform SL heel raise 5  Ankle inversion 4+ 4 4 5   Ankle eversion 4 4+ 4+ 5   (Blank rows = not tested)  LOWER EXTREMITY ROM:  AROM Right eval Left eval Right 07/28/22 Left 07/28/22  Hip flexion      Hip extension      Hip abduction      Hip adduction      Hip internal rotation      Hip external rotation      Knee flexion      Knee extension      Ankle dorsiflexion 4 6 15 18   Ankle plantarflexion 50 55    Ankle inversion 18 20    Ankle eversion 22 pain 32 30 pain 35   (Blank rows = not tested)   TODAY'S TREATMENT:       [facility] Adult PT Treatment:                                                DATE: 08/30/2022 Therapeutic  Exercise: Recumbent bike L3 x 5 min Seated: Towel scrunch + toe splay (alt)  Toe yoga  Great toe extension stretch Ankle circles + fingers threaded through toes Resisted ankle circles (ball of foot anchored on floor) GTB around ankle (band anchored around chair legs) Soleus press 0# x 10 --> 15#KB resting on knee x10 (B) Runner's lunge: gastroc/soleus stretches with toe extension on towel roll 2x30" each (B) Single leg heel raises x10 (B) Eccentric single leg heel lowers 2x10 (R), x10 (L)  Long sitting calf stretch with towel (wrapped around foot) 2x30" Standing on 2 yoga blocks, arch unsupported 3x30" (B)    [facility] Adult PT Treatment:                                                DATE: 08/23/2022 Therapeutic Exercise: Recumbent bike L3 x 5 min Seated: Toe yoga  Towel scrunch + toe splay (alt)  Ankle circles + fingers threaded through toes Resisted ankle circles (ball of foot anchored on floor) --> added RTB around ankle (band anchored around chair legs) Toe extension over edge of yoga block x15 Soleus press 0# x 10 --> 15#KB resting on knee x10 (B) Standing on 2 yoga blocks, arch unsupported 3x30" each LE  Runner's lunge: gastroc/soleus stretches with toe extension on towel roll 2x30" each (B) Eccentric single leg heel lowers x10 B    [facility] Adult PT Treatment:                                                DATE: 08/11/22 Therapeutic Exercise: Recumbent L4 x 5 min for warm up Kneeling toe flexor stretch 2x30 sec Kneeling toe extensor stretch 2x30 sec Plantar flexion into toe flexion green TB 2x10 Dorsiflexion into toe extension green TB 2x10  Eccentric heel raise on step 2 x 10 Standing arch lifting with toe ext x 10 bilat Seated toe splaying x 10 bilat Standing balance blue side of BOSU 2 x 30 sec bilat  PATIENT EDUCATION:  Education details: PT POC and goals, HEP Person educated: Patient Education method: Explanation, Demonstration, and Handouts Education  comprehension: verbalized understanding and returned demonstration  HOME EXERCISE PROGRAM: Access Code: [email or web address] URL: [email or web address] Date: 06/23/2022 Prepared by: Reggy Eye  Exercises - Ankle Inversion with Resistance  - 1 x daily - 7 x weekly - 3 sets - 10 reps - Ankle Eversion with Resistance  - 1 x daily - 7 x weekly - 3 sets - 10 reps - Toe Yoga - Alternating Great Toe and Lesser Toe Extension  - 1 x daily - 7 x weekly - 1 sets - 10 reps - Seated Heel Raise  - 1 x daily - 7 x weekly - 3 sets - 10 reps - Gastroc Stretch on Wall  - 1 x daily - 7 x weekly - 1 sets - 3 reps - 20-30 sec hold - Soleus Stretch on Wall  - 1 x daily - 7 x weekly - 1 sets - 3 reps - 20-30 sec hold - Heel Raises with Counter Support  - 1 x daily - 7 x weekly - 3 sets - 10 reps - Standing Heel Raise with Toes Turned Out  - 1 x daily - 7 x weekly - 3 sets - 10 reps - Side Stepping with Resistance at Feet  - 1 x daily - 7 x weekly - 3 sets - 10 reps - Single Leg Balance with Clock Reach  - 1 x daily - 7 x weekly - 3 sets - 10 reps  ASSESSMENT:  CLINICAL IMPRESSION: Patient able to perform single leg heel raises on R LE with no pain and almost full ROM. Eccentric heel raises continued to progress ankle stability and LE strength. Good ankle strategy exhibited during single leg balance with arch unsupported on blocks.    OBJECTIVE IMPAIRMENTS: decreased activity tolerance, difficulty walking, decreased ROM, decreased strength, and pain.     GOALS: Goals reviewed with patient? Yes  SHORT TERM GOALS: Target date: 06/30/2022  Pt will be independent with initial HEP Baseline: Goal status: MET   LONG TERM GOALS: Target date: 09/08/2022  Pt will be independent with advanced HEP Baseline:  Goal status: MET  2.  Pt will improve bilat ankle strength to 5/5 to be able work with decreased pain Baseline:  07/28/22: see MMT above Goal status: IN PROGRESS   3.  Pt will tolerate working  12 hour shift with foot/ankle pain <= 2/10 Baseline:  07/28/22: 3-5/10 depending on how busy work is Goal status: IN PROGRESS   4.  Pt will be able to perform SL heel raise on the right to demo increased ankle/foot strength and stability Baseline: 3/4 ROM, no pain Goal status: MET  PLAN:  PT FREQUENCY: 1-2x/week  PT DURATION: 6 weeks  PLANNED INTERVENTIONS: Therapeutic exercises, Therapeutic activity, Neuromuscular re-education, Balance training, Gait training, Patient/Family education, Self Care, Joint mobilization, Dry Needling, Electrical stimulation, Cryotherapy, Moist heat, Taping, Vasopneumatic device, Ultrasound, Ionotophoresis 4mg /ml Dexamethasone, Manual therapy, and Re-evaluation  PLAN FOR NEXT SESSION:  Re-eval next visit   Sanjuana Mae, PTA 08/30/2022, 2:48 PM

## 2022-09-09 ENCOUNTER — Encounter: Payer: Self-pay | Admitting: Physical Therapy

## 2022-09-09 ENCOUNTER — Ambulatory Visit: Payer: Commercial Managed Care - PPO | Admitting: Physical Therapy

## 2022-09-09 DIAGNOSIS — M76821 Posterior tibial tendinitis, right leg: Secondary | ICD-10-CM | POA: Diagnosis not present

## 2022-09-09 DIAGNOSIS — M76822 Posterior tibial tendinitis, left leg: Secondary | ICD-10-CM

## 2022-09-09 DIAGNOSIS — M722 Plantar fascial fibromatosis: Secondary | ICD-10-CM

## 2022-09-09 NOTE — Therapy (Signed)
OUTPATIENT PHYSICAL THERAPY LOWER EXTREMITY TREATMENT AND DISCHARGE  PHYSICAL THERAPY DISCHARGE SUMMARY  Visits from Start of Care: 11  Current functional level related to goals / functional outcomes: See below   Remaining deficits: See below   Education / Equipment: See below   Patient agrees to discharge. Patient goals were partially met. Patient is being discharged due to being pleased with the current functional level.    Patient Name: Elizabeth Perkins MRN: [id number] DOB:10-27-1985, 37 y.o., female Today's Date: 09/09/2022  END OF SESSION:  PT End of Session - 09/09/22 1447     Visit Number 11    Number of Visits 12    Date for PT Re-Evaluation 09/08/22    PT Start Time 1447    PT Stop Time 1530    PT Time Calculation (min) 43 min    Activity Tolerance Patient tolerated treatment well    Behavior During Therapy [facility] for tasks assessed/performed                Past Medical History:  Diagnosis Date   Abnormal Pap smear of cervix    HGSIL 2019 Never followed up   Cystic fibrosis gene carrier 04/14/2020   FOB has negative CF testing.    HGSIL (high grade squamous intraepithelial lesion) on Pap smear of cervix 12/15/2016   Formatting of this note is different from the original.  ASCUS/HPV+ on 12-02-16  02/2017: CIN 1  Needs cotesting in 11/2017   HPV in female    Plantar fasciitis, right    Vaginal Pap smear, abnormal    ASCUS   Vitamin D deficiency    Past Surgical History:  Procedure Laterality Date   CESAREAN SECTION N/A 10/03/2020   Procedure: CESAREAN SECTION;  Surgeon: Warden Fillers, MD;  Location: [facility] LD ORS;  Service: Obstetrics;  Laterality: N/A;   COLPOSCOPY     There are no problems to display for this patient.   PCP: Juliene Pina  REFERRING PROVIDER: Louann Sjogren  REFERRING DIAG: bilat posterior tibial tendon dysfunction, bilat plantar fasciitis  THERAPY DIAG:  Posterior tibial tendon dysfunction (PTTD) of right lower  extremity  Bilateral plantar fasciitis  Posterior tibial tendon dysfunction (PTTD) of left lower extremity  Rationale for Evaluation and Treatment: Rehabilitation  ONSET DATE: 05/2022  SUBJECTIVE:   SUBJECTIVE STATEMENT: Pt states her feet and ankles are much better now that she does not have to be constantly on her feet. Pt reports she has not had to use her brace in the last 2 weeks.   PERTINENT HISTORY: Plantar fasciitis 2022 Pt has been having Rt arch and medial ankle pain worsening for the past few months, she states the pain is beginning to start on her Lt foot too. Pain increases with prolonged walking and standing. She wears a brace PRN on Lt foot, at all times when out of bed on Rt foot. Pain decreases with stretching, ice and rest PAIN:  Are you having pain? Yes: NPRS scale: 3/10 currently in Rt arch/10 Pain location: Rt arch Pain description: sore, tight Aggravating factors: prolonged standing and walking Relieving factors: rest, ice  PRECAUTIONS: None  WEIGHT BEARING RESTRICTIONS: No  FALLS:  Has patient fallen in last 6 months? 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: Independent  PATIENT GOALS: work with decreased pain  NEXT MD VISIT: 07/29/22  OBJECTIVE:    PALPATION: TTP Rt plantar fascia, Rt and Lt posterior tibialis tendons TC and metatarsal jt mobility WFL bilat  LOWER EXTREMITY ROM:  MMT Right eval Left eval Right 07/28/22 Left 07/28/22 Right 09/09/22 Left 09/09/22  Hip flexion        Hip extension        Hip abduction        Hip adduction        Hip internal rotation        Hip external rotation        Knee flexion        Knee extension        Ankle dorsiflexion 4 4+ 4+ * 5 5 5   Ankle plantarflexion 4 pain 4 Unable to perform SL heel raise 5  5  Ankle inversion 4+ 4 4 5 4 5   Ankle eversion 4 4+ 4+ 5 5 5    (Blank rows = not tested)  LOWER EXTREMITY ROM:  AROM Right eval Left eval Right 07/28/22 Left 07/28/22  Hip flexion       Hip extension      Hip abduction      Hip adduction      Hip internal rotation      Hip external rotation      Knee flexion      Knee extension      Ankle dorsiflexion 4 6 15 18   Ankle plantarflexion 50 55    Ankle inversion 18 20    Ankle eversion 22 pain 32 30 pain 35   (Blank rows = not tested)   TODAY'S TREATMENT:       [facility] Adult PT Treatment:                                                DATE: 09/09/22 Therapeutic Exercise: Recumbent bike L3 x 5 min Seated ankle inversion blue TB 2x10 R&L Standing single leg heel raise 2x10 R&L Double leg hopping in place x10 Double leg hopping side to side x10 Standing on 2 yoga blocks, arch unsupported 3x30" (B) Standing on yoga block, knee slightly flexed heel raise with arch lifted 2x5 Forward T 2x10 R&L Standing gastroc stretch x 30 sec R&L Standing soleus stretch x 30 sec R&L   [facility] Adult PT Treatment:                                                DATE: 08/30/2022 Therapeutic Exercise: Recumbent bike L3 x 5 min Seated: Towel scrunch + toe splay (alt)  Toe yoga  Great toe extension stretch Ankle circles + fingers threaded through toes Resisted ankle circles (ball of foot anchored on floor) GTB around ankle (band anchored around chair legs) Soleus press 0# x 10 --> 15#KB resting on knee x10 (B) Runner's lunge: gastroc/soleus stretches with toe extension on towel roll 2x30" each (B) Single leg heel raises x10 (B) Eccentric single leg heel lowers 2x10 (R), x10 (L)  Long sitting calf stretch with towel (wrapped around foot) 2x30" Standing on 2 yoga blocks, arch unsupported 3x30" (B)    [facility] Adult PT Treatment:  DATE: 08/23/2022 Therapeutic Exercise: Recumbent bike L3 x 5 min Seated: Toe yoga  Towel scrunch + toe splay (alt)  Ankle circles + fingers threaded through toes Resisted ankle circles (ball of foot anchored on floor) --> added RTB around ankle (band anchored around  chair legs) Toe extension over edge of yoga block x15 Soleus press 0# x 10 --> 15#KB resting on knee x10 (B) Standing on 2 yoga blocks, arch unsupported 3x30" each LE  Runner's lunge: gastroc/soleus stretches with toe extension on towel roll 2x30" each (B) Eccentric single leg heel lowers x10 B   PATIENT EDUCATION:  Education details: PT POC and goals, HEP Person educated: Patient Education method: Explanation, Demonstration, and Handouts Education comprehension: verbalized understanding and returned demonstration  HOME EXERCISE PROGRAM: Access Code: [email or web address] URL: [email or web address] Date: 09/09/2022 Prepared by: Vernon Prey April Kirstie Peri  Exercises - Ankle Inversion with Resistance  - 1 x daily - 7 x weekly - 3 sets - 10 reps - Ankle Eversion with Resistance  - 1 x daily - 7 x weekly - 3 sets - 10 reps - Toe Yoga - Alternating Great Toe and Lesser Toe Extension  - 1 x daily - 7 x weekly - 1 sets - 10 reps - Seated Heel Raise  - 1 x daily - 7 x weekly - 3 sets - 10 reps - Gastroc Stretch on Wall  - 1 x daily - 7 x weekly - 1 sets - 3 reps - 20-30 sec hold - Soleus Stretch on Wall  - 1 x daily - 7 x weekly - 1 sets - 3 reps - 20-30 sec hold - Heel Raises with Counter Support  - 1 x daily - 7 x weekly - 3 sets - 10 reps - Standing Heel Raise with Toes Turned Out  - 1 x daily - 7 x weekly - 3 sets - 10 reps - Side Stepping with Resistance at Feet  - 1 x daily - 7 x weekly - 3 sets - 10 reps - Single Leg Balance with Clock Reach  - 1 x daily - 7 x weekly - 3 sets - 10 reps - Arch Lifting  - 1 x daily - 7 x weekly - 2 sets - 10 reps - Forward T Arms Overhead with Band  - 1 x daily - 7 x weekly - 2 sets - 10 reps  ASSESSMENT:  CLINICAL IMPRESSION: Pt is demonstrating good increase in strength and stability. Has some continued posterior tibialis weakness in R foot vs L. Went through pt's exercises and discussed self progressions at home. Pt to include gentle plyometrics.  Pt has some continued pain that has improved but gait is no longer antalgic. Pt has met or partially met all of her LTGs and feels ready for PT d/c.    OBJECTIVE IMPAIRMENTS: decreased activity tolerance, difficulty walking, decreased ROM, decreased strength, and pain.     GOALS: Goals reviewed with patient? Yes  SHORT TERM GOALS: Target date: 06/30/2022  Pt will be independent with initial HEP Baseline: Goal status: MET   LONG TERM GOALS: Target date: 09/08/2022  Pt will be independent with advanced HEP Baseline:  Goal status: MET  2.  Pt will improve bilat ankle strength to 5/5 to be able work with decreased pain Baseline:  07/28/22: see MMT above 09/09/22: Everything 5/5 except R ankle inversion Goal status: PARTIALLY MET  3.  Pt will tolerate working 12 hour shift with foot/ankle pain <= 2/10 Baseline:  07/28/22: 3-5/10 depending on  how busy work is 09/09/22: up to 4/10 on a busy day Goal status: PARTIALLY MET  4.  Pt will be able to perform SL heel raise on the right to demo increased ankle/foot strength and stability Baseline: 3/4 ROM, no pain Goal status: MET  PLAN:  PT FREQUENCY: 1-2x/week  PT DURATION: 6 weeks  PLANNED INTERVENTIONS: Therapeutic exercises, Therapeutic activity, Neuromuscular re-education, Balance training, Gait training, Patient/Family education, Self Care, Joint mobilization, Dry Needling, Electrical stimulation, Cryotherapy, Moist heat, Taping, Vasopneumatic device, Ultrasound, Ionotophoresis 4mg /ml Dexamethasone, Manual therapy, and Re-evaluation  PLAN FOR NEXT SESSION:  D/C   Wren Pryce April Ma L Asencion Loveday, PT 09/09/2022, 2:48 PM

## 2023-01-19 DIAGNOSIS — E66811 Obesity, class 1: Secondary | ICD-10-CM | POA: Diagnosis not present

## 2023-01-19 DIAGNOSIS — Z124 Encounter for screening for malignant neoplasm of cervix: Secondary | ICD-10-CM | POA: Diagnosis not present

## 2023-01-19 DIAGNOSIS — G43019 Migraine without aura, intractable, without status migrainosus: Secondary | ICD-10-CM | POA: Diagnosis not present

## 2023-01-19 DIAGNOSIS — E010 Iodine-deficiency related diffuse (endemic) goiter: Secondary | ICD-10-CM | POA: Diagnosis not present

## 2023-01-19 DIAGNOSIS — E559 Vitamin D deficiency, unspecified: Secondary | ICD-10-CM | POA: Diagnosis not present

## 2023-01-19 DIAGNOSIS — E6609 Other obesity due to excess calories: Secondary | ICD-10-CM | POA: Diagnosis not present

## 2023-01-19 DIAGNOSIS — Z131 Encounter for screening for diabetes mellitus: Secondary | ICD-10-CM | POA: Diagnosis not present

## 2023-01-19 DIAGNOSIS — Z1330 Encounter for screening examination for mental health and behavioral disorders, unspecified: Secondary | ICD-10-CM | POA: Diagnosis not present

## 2023-01-19 DIAGNOSIS — Z1331 Encounter for screening for depression: Secondary | ICD-10-CM | POA: Diagnosis not present

## 2023-01-19 DIAGNOSIS — Z0000 Encounter for general adult medical examination without abnormal findings: Secondary | ICD-10-CM | POA: Diagnosis not present

## 2023-01-19 DIAGNOSIS — E782 Mixed hyperlipidemia: Secondary | ICD-10-CM | POA: Diagnosis not present

## 2023-01-19 DIAGNOSIS — R87613 High grade squamous intraepithelial lesion on cytologic smear of cervix (HGSIL): Secondary | ICD-10-CM | POA: Diagnosis not present

## 2023-09-01 ENCOUNTER — Ambulatory Visit

## 2023-09-01 VITALS — BP 107/69 | HR 76 | Ht 61.5 in | Wt 192.0 lb

## 2023-09-01 DIAGNOSIS — N926 Irregular menstruation, unspecified: Secondary | ICD-10-CM | POA: Diagnosis not present

## 2023-09-01 DIAGNOSIS — Z3201 Encounter for pregnancy test, result positive: Secondary | ICD-10-CM | POA: Diagnosis not present

## 2023-09-01 LAB — POCT URINE PREGNANCY: Preg Test, Ur: POSITIVE — AB

## 2023-09-01 NOTE — Progress Notes (Signed)
 Elizabeth Perkins here for a UPT. Pt had a positive upt at home. LMP is 07/08/2023.     UPT in office Positive.    Reviewed medications and informed to start a PNV, if not already. Pt to follow up in 5+ weeks for New OB visit.   Safe otc medications list given to patient. Reviewed when to notify provider and or go to MAU.   Silvano LELON Piano, RN

## 2023-09-02 ENCOUNTER — Ambulatory Visit

## 2023-09-02 DIAGNOSIS — Z3201 Encounter for pregnancy test, result positive: Secondary | ICD-10-CM | POA: Diagnosis not present

## 2023-09-02 DIAGNOSIS — Z3A08 8 weeks gestation of pregnancy: Secondary | ICD-10-CM

## 2023-09-02 DIAGNOSIS — O09511 Supervision of elderly primigravida, first trimester: Secondary | ICD-10-CM | POA: Diagnosis not present

## 2023-09-02 DIAGNOSIS — Z3682 Encounter for antenatal screening for nuchal translucency: Secondary | ICD-10-CM | POA: Diagnosis not present

## 2023-09-26 ENCOUNTER — Encounter: Payer: Self-pay | Admitting: Obstetrics and Gynecology

## 2023-09-26 ENCOUNTER — Other Ambulatory Visit (HOSPITAL_COMMUNITY)
Admission: RE | Admit: 2023-09-26 | Discharge: 2023-09-26 | Disposition: A | Discharge status: Home / Self Care | Source: Ambulatory Visit | Attending: Obstetrics and Gynecology | Admitting: Obstetrics and Gynecology

## 2023-09-26 ENCOUNTER — Ambulatory Visit: Admitting: Obstetrics and Gynecology

## 2023-09-26 VITALS — Wt 188.0 lb

## 2023-09-26 DIAGNOSIS — O09899 Supervision of other high risk pregnancies, unspecified trimester: Secondary | ICD-10-CM | POA: Insufficient documentation

## 2023-09-26 DIAGNOSIS — Z3480 Encounter for supervision of other normal pregnancy, unspecified trimester: Secondary | ICD-10-CM | POA: Insufficient documentation

## 2023-09-26 DIAGNOSIS — Z98891 History of uterine scar from previous surgery: Secondary | ICD-10-CM | POA: Diagnosis not present

## 2023-09-26 DIAGNOSIS — O99211 Obesity complicating pregnancy, first trimester: Secondary | ICD-10-CM

## 2023-09-26 DIAGNOSIS — O09519 Supervision of elderly primigravida, unspecified trimester: Secondary | ICD-10-CM

## 2023-09-26 DIAGNOSIS — O09511 Supervision of elderly primigravida, first trimester: Secondary | ICD-10-CM | POA: Diagnosis not present

## 2023-09-26 DIAGNOSIS — Z3A11 11 weeks gestation of pregnancy: Secondary | ICD-10-CM | POA: Diagnosis not present

## 2023-09-26 MED ORDER — ASPIRIN 81 MG PO TBEC: 81.0000 mg | DELAYED_RELEASE_TABLET | Freq: Every day | ORAL | 2 refills | Status: AC

## 2023-09-26 NOTE — Progress Notes (Signed)
 INITIAL PRENATAL VISIT NOTE  Subjective:  Elizabeth Perkins is a 38 y.o. G3P1011 at [redacted]w[redacted]d by LMP c/w 6 weeks US  being seen today for her initial prenatal visit. She has an obstetric history significant for 1LTCS for arrest of descent. She has a medical history significant for abnormal pap smear, last was 01/2023 and was normal.  Patient reports fatigue and nausea.  Contractions: Not present. Vag. Bleeding: None.  Movement: Absent. Denies leaking of fluid.    Past Medical History:  Diagnosis Date   Abnormal Pap smear of cervix    HGSIL 2019 Never followed up   Cystic fibrosis gene carrier 04/14/2020   FOB has negative CF testing.    HGSIL (high grade squamous intraepithelial lesion) on Pap smear of cervix 12/15/2016   Formatting of this note is different from the original.  ASCUS/HPV+ on 12-02-16  02/2017: CIN 1  Needs cotesting in 11/2017   HPV in female    Plantar fasciitis, right    Vaginal Pap smear, abnormal    ASCUS   Vitamin D deficiency     Past Surgical History:  Procedure Laterality Date   CESAREAN SECTION N/A 10/03/2020   Procedure: CESAREAN SECTION;  Surgeon: Zina Jerilynn LABOR, MD;  Location: [facility] LD ORS;  Service: Obstetrics;  Laterality: N/A;   COLPOSCOPY      OB History  Gravida Para Term Preterm AB Living  3 1 1  1 1   SAB IAB Ectopic Multiple Live Births     0 1    # Outcome Date GA Lbr Len/2nd Weight Sex Type Anes PTL Lv  3 Current           2 AB 06/2022          1 Term 10/03/20 [redacted]w[redacted]d 14:53 / 04:30 6 lb 14.1 oz (3.121 kg) F CS-LTranv EPI  LIV     Birth Comments: WNL    Social History   Socioeconomic History   Marital status: Single    Spouse name: Not on file   Number of children: Not on file   Years of education: Not on file   Highest education level: Not on file  Occupational History   Not on file  Tobacco Use   Smoking status: Never   Smokeless tobacco: Never  Vaping Use   Vaping status: Never Used  Substance and Sexual Activity   Alcohol  use: Not Currently    Comment: rare   Drug use: Never   Sexual activity: Yes  Other Topics Concern   Not on file  Social History Narrative   Not on file   Social Drivers of Health   Financial Resource Strain: Low Risk  (01/19/2023)   Received from [facility]   Overall Financial Resource Strain (CARDIA)    Difficulty of Paying Living Expenses: Not very hard  Food Insecurity: No Food Insecurity (01/19/2023)   Received from [facility]   Hunger Vital Sign    Within the past 12 months, you worried that your food would run out before you got the money to buy more.: Never true    Within the past 12 months, the food you bought just didn't last and you didn't have money to get more.: Never true  Transportation Needs: No Transportation Needs (01/19/2023)   Received from [facility] - Transportation    Lack of Transportation (Medical): No    Lack of Transportation (Non-Medical): No  Physical Activity: Inactive (01/19/2023)   Received from [facility]  Exercise Vital Sign    On average, how many days per week do you engage in moderate to strenuous exercise (like a brisk walk)?: 1 day    On average, how many minutes do you engage in exercise at this level?: 0 min  Stress: No Stress Concern Present (01/19/2023)   Received from [facility] - Occupational Stress Questionnaire    Feeling of Stress : Only a little  Social Connections: Moderately Integrated (01/19/2023)   Received from [facility]   Social Network    How would you rate your social network (family, work, friends)?: Adequate participation with social networks    Family History  Problem Relation Age of Onset   Healthy Mother    Healthy Father      Current Outpatient Medications:    acetaminophen  (TYLENOL ) 500 MG tablet, Take 500 mg by mouth every 6 (six) hours as needed., Disp: , Rfl:    aspirin  EC 81 MG tablet, Take 1 tablet (81 mg total) by mouth at bedtime.  Start taking when you are [redacted] weeks pregnant for rest of pregnancy for prevention of preeclampsia, Disp: 300 tablet, Rfl: 2   Prenatal Vit-Fe Fumarate-FA (PRENATAL PO), Take by mouth daily., Disp: , Rfl:   No Known Allergies  Review of Systems: Negative except for what is mentioned in HPI.  Objective:   Vitals:   09/26/23 1525  Weight: 188 lb (85.3 kg)    Fetal Status: Fetal Heart Rate (bpm): 154   Movement: Absent     Physical Exam: Wt 188 lb (85.3 kg)   LMP 07/08/2023   BMI 34.95 kg/m  CONSTITUTIONAL: Well-developed, well-nourished female in no acute distress.  NEUROLOGIC: Alert and oriented to person, place, and time. Normal reflexes, muscle tone coordination. No cranial nerve deficit noted. PSYCHIATRIC: Normal mood and affect. Normal behavior. Normal judgment and thought content. SKIN: Skin is warm and dry. No rash noted. Not diaphoretic. No erythema. No pallor. HENT:  Normocephalic, atraumatic, External right and left ear normal. Oropharynx is clear and moist EYES: Conjunctivae and EOM are normal. Pupils are equal, round, and reactive to light. No scleral icterus.  NECK: Normal range of motion, supple, no masses CARDIOVASCULAR: Normal heart rate noted, regular rhythm RESPIRATORY: Effort normal, no problems with respiration noted BREASTS: deferred ABDOMEN: Soft, nontender, nondistended, gravid. GU: deferred MUSCULOSKELETAL: Normal range of motion. EXT:  No edema and no tenderness.    Assessment and Plan:  Pregnancy: G3P1011 at [redacted]w[redacted]d by LMP  1. Supervision of other normal pregnancy, antepartum (Primary) Reviewed [facility] [employer] structure, multiple providers, fellows, medical students, virtual visits, MyChart.  Provided anticipatory guidance for pregnancy, visits, delivery, etc. - CBC/D/Plt+RPR+Rh+ABO+RubIgG... - Culture, OB Urine - Cervicovaginal ancillary only - Hemoglobin A1c - PANORAMA PRENATAL TEST - US  MFM OB DETAIL +14 WK; Future  2.  H/O: C-section Arrest of descent Was told she would need RCS, will discuss at future visits  3. AMA (advanced maternal age) primigravida 35+, unspecified trimester Start baby ASA  4. Obesity affecting pregnancy in first trimester, unspecified obesity type    Preterm labor symptoms and general obstetric precautions including but not limited to vaginal bleeding, contractions, leaking of fluid and fetal movement were reviewed in detail with the patient.  Please refer to After Visit Summary for other counseling recommendations.   Return in about 1 month (around 10/27/2023) for high OB.  Burnard CHRISTELLA Moats 09/26/2023 3:59 PM

## 2023-09-27 ENCOUNTER — Ambulatory Visit: Payer: Self-pay | Admitting: Obstetrics and Gynecology

## 2023-09-27 LAB — CBC/D/PLT+RPR+RH+ABO+RUBIGG...
Antibody Screen: NEGATIVE
Basophils Absolute: 0 x10E3/uL (ref 0.0–0.2)
Basos: 0 %
EOS (ABSOLUTE): 0.1 x10E3/uL (ref 0.0–0.4)
Eos: 1 %
HCV Ab: NONREACTIVE
HIV Screen 4th Generation wRfx: NONREACTIVE
Hematocrit: 37.4 % (ref 34.0–46.6)
Hemoglobin: 12.4 g/dL (ref 11.1–15.9)
Hepatitis B Surface Ag: NEGATIVE
Immature Grans (Abs): 0 x10E3/uL (ref 0.0–0.1)
Immature Granulocytes: 0 %
Lymphocytes Absolute: 1.9 x10E3/uL (ref 0.7–3.1)
Lymphs: 18 %
MCH: 27.6 pg (ref 26.6–33.0)
MCHC: 33.2 g/dL (ref 31.5–35.7)
MCV: 83 fL (ref 79–97)
Monocytes Absolute: 0.8 x10E3/uL (ref 0.1–0.9)
Monocytes: 8 %
Neutrophils Absolute: 7.5 x10E3/uL — ABNORMAL HIGH (ref 1.4–7.0)
Neutrophils: 73 %
Platelets: 310 x10E3/uL (ref 150–450)
RBC: 4.5 x10E6/uL (ref 3.77–5.28)
RDW: 13.5 % (ref 11.7–15.4)
RPR Ser Ql: NONREACTIVE
Rh Factor: POSITIVE
Rubella Antibodies, IGG: 2.41 {index} (ref >0.99)
WBC: 10.3 x10E3/uL (ref 3.4–10.8)

## 2023-09-27 LAB — HEMOGLOBIN A1C
Est. average glucose Bld gHb Est-mCnc: 105 mg/dL
Hgb A1c MFr Bld: 5.3 % (ref 4.8–5.6)

## 2023-09-27 LAB — CERVICOVAGINAL ANCILLARY ONLY
Chlamydia: NEGATIVE
Comment: NEGATIVE
Comment: NORMAL
Neisseria Gonorrhea: NEGATIVE

## 2023-09-27 LAB — HCV INTERPRETATION

## 2023-09-29 LAB — CULTURE, OB URINE

## 2023-09-29 LAB — URINE CULTURE, OB REFLEX

## 2023-10-04 DIAGNOSIS — O3680X0 Pregnancy with inconclusive fetal viability, not applicable or unspecified: Secondary | ICD-10-CM | POA: Diagnosis not present

## 2023-10-04 DIAGNOSIS — I10 Essential (primary) hypertension: Secondary | ICD-10-CM | POA: Diagnosis not present

## 2023-10-04 DIAGNOSIS — M2022 Hallux rigidus, left foot: Secondary | ICD-10-CM | POA: Diagnosis not present

## 2023-10-04 DIAGNOSIS — Z3A13 13 weeks gestation of pregnancy: Secondary | ICD-10-CM | POA: Diagnosis not present

## 2023-10-04 DIAGNOSIS — O26891 Other specified pregnancy related conditions, first trimester: Secondary | ICD-10-CM | POA: Diagnosis not present

## 2023-10-04 DIAGNOSIS — O209 Hemorrhage in early pregnancy, unspecified: Secondary | ICD-10-CM | POA: Diagnosis not present

## 2023-10-05 ENCOUNTER — Telehealth: Payer: Self-pay

## 2023-10-05 DIAGNOSIS — O3680X0 Pregnancy with inconclusive fetal viability, not applicable or unspecified: Secondary | ICD-10-CM | POA: Diagnosis not present

## 2023-10-05 NOTE — ED Provider Notes (Signed)
 [facility] Elizabeth Perkins  ED Provider Note  Tandrea Kommer 38 y.o. female DOB: [birth year] MRN: [id number] History   Chief Complaint  Patient presents with  . Vaginal Bleeding (Pregnant)    Vaginal bleeding, pt reports [redacted] weeks pregnant, beginning one hour ago,light bleeding, tapering off to spotting now.  Denies abdominal cramping.      Vaginal Bleeding (Pregnant) Associated symptoms: no abdominal pain, no back pain, no dysuria, no fever and no nausea     37 YO F AT 12 WEEKS PREG W/ LIGHT VAGINAL BLEEDING TODAY; NO PAIN; NO DISCHARGE; NO DYSURIA; DENIES TRAUMA  NO FEVER/CHILLS    History reviewed. No pertinent past medical history.  Past Surgical History:  Procedure Laterality Date  . Cesarean section  10/03/2020  . Colposcopy w/ biopsy / curettage  02/23/2017   LGSIL - repeat pap in one year / Dr Sharman    Social History   Substance and Sexual Activity  Alcohol Use Yes  . Alcohol/week: 1.0 standard drink of alcohol  . Types: 1 Glasses of wine per week   Comment: rare   Tobacco Use History[1] E-Cigarettes  . Vaping Use Never User   . Start Date    . Cartridges/Day    . Quit Date     Social History   Substance and Sexual Activity  Drug Use No         Allergies[2]  Home Medications   MULTIPLE VITAMIN (MULTIVITAMIN) TABLET    Take one tablet by mouth daily.   SUMATRIPTAN SUCCINATE (IMITREX) 25 MG TABLET    TAKE 1 TABLET BY MOUTH EVERY 2 HOURS AS NEEDED FOR MIGRAINE. NO MORE THAN 4 TABLETS IN 24 HOURS    Primary Survey  Primary Survey  Review of Systems   Review of Systems  Constitutional:  Negative for chills and fever.  HENT:  Negative for ear pain and sore throat.   Eyes:  Negative for pain and visual disturbance.  Respiratory:  Negative for cough and shortness of breath.   Cardiovascular:  Negative for chest pain and palpitations.  Gastrointestinal:  Negative for abdominal pain, diarrhea, nausea and vomiting.   Genitourinary:  Positive for vaginal bleeding. Negative for dysuria and hematuria.  Musculoskeletal:  Negative for arthralgias and back pain.  Skin:  Negative for color change and rash.  Neurological:  Negative for seizures and syncope.  All other systems reviewed and are negative.   Physical Exam   ED Triage Vitals [10/04/23 2228]  BP 128/71  Heart Rate 91  Resp 18  SpO2 99 %  Temp 98.7 F (37.1 C)    Physical Exam  Nursing note and vitals reviewed. Constitutional: She appears well-developed and well-nourished. She does not appear distressed, does not appear ill and no respiratory distress.  HENT:  Head: Normocephalic and atraumatic.  Nose: Nose normal.  Mouth/Throat: Moist mucous membrane. No oropharyngeal exudate.Voice normal.  Eyes: EOM are intact. Pupils are equal, round, and reactive to light. No scleral icterus.  Neck: Voice normal. Neck supple.  Cardiovascular: Normal rate and regular rhythm. No friction rub.  Pulmonary/Chest: No respiratory distress. Good air movement. Not tachypneic. Respiratory effort normal and breath sounds normal.  Abdominal: Soft. There is no guarding. Abdomen not distended. Bowel sounds are normal.  Musculoskeletal: No obvious deformity noted to extremities.     Cervical back: Neck supple.   Neurological: She is alert and oriented to person, place, and time. Strength 5/5 bilateral upper and lower extremities.  Skin: Skin is warm. Skin is  dry.  Psychiatric: She has a normal mood and affect. Her behavior is normal. Judgment and thought content normal.     ED Course   Lab results:   CBC - Abnormal      Result Value   WBC 12.8 (*)    RBC 4.57     HGB 12.5     HCT 36.6     MCV 80.1     MCH 27.4     MCHC 34.2     Plt Ct 331     RDW SD 39.7     MPV 9.9     NRBC% 0.0     Absolute NRBC Count 0.00    URINE CUP   Narrative:    The following orders were created for panel order Urine Cup Urine, Clean Catch. Procedure                                Abnormality         Status                    ---------                               -----------         ------                    Urine Rle[[id number]]                                       Final result               Please view results for these tests on the individual orders.  RH TYPE   Rh POS    HCG, QUANTITATIVE   B HCG Quant [id number],791     Comment: Female -  0 - 3 mIU/mL  Female Non-pregnant -   0 - 5 mIU/mL  Female Postmenopausal  - 0 - 8 mIU/mL  Female Pregnant by week of gestation: 3 weeks-   6 - 71 mIU/mL 4 weeks-   10 - 750 mIU/mL 5 weeks-  217 - 7138 mIU/mL 6 weeks-  158 - 31795 mIU/mL 7 weeks- 3697 - 836436 mIU/mL 8 weeks- 67934 - 149571 mIU/mL 9 weeks-  36196 - 151410 mIU/mL 10 weeks- 53490 - 813022 mIU/mL 12 weeks- 72167 - 210612 mIU/mL 14 weeks- 86049 - 62530 mIU/mL 15 weeks- 87960 - 70971 mIU/mL 16 weeks- 9040 - 56451 mIU/mL 17 weeks- 8175 - 55868 mIU/mL 18 weeks- 8099 - 58176 mIU/mL   URINALYSIS W/MICRO REFLEX CULTURE - SYMPTOMATIC  URINE CUP  LIGHT BLUE TOP  MINT GREEN-TOP TUBE (PST GEL/LI HEP)  GOLD SST    Imaging:   US  OB < 14 WKS TRANSABDOMINAL/TRANSVAGINAL   Narrative:    EXAM: US  OB < 14 WKS TRANSABDOMINAL/TRANSVAGINAL  HISTORY: Viability  TECHNIQUE: Sonographic imaging was performed utilizing transabdominal and transvaginal approach.  COMPARISON: None available.  FINDINGS:   Uterus: 12.4 x 8.1 x 7.6 cm. Gestational sac: 8 cm Fetal Pole: Crown-rump length 7 cm corresponding to 13 weeks 1 day Cardiac activity: 133 BPM Placenta is posterior and marginal previa.  The ovaries are obscured by bowel gas.  Free Fluid: None    Impression:     IMPRESSION:  Single live intrauterine gestation at 13 weeks 1 day.  Electronically Signed by: Dorn Burkes on 10/05/2023 12:38 AM      ECG: ECG Results   None                                                                      Pre-Sedation Procedures  ED Course as of 10/05/23 0118  Grayce HERO Moore's Documentation  Wed Oct 05, 2023  0102 LIVE IUP ON U/S  D/C W/ PELVIC REST AND CLOSE OB F/U   Medical Decision Making DDX: SUBCHORIONIC HEMORRHAGE THREATENED MISCARRIAGE UTI  U/S SHOWING IUP HAS OB FOR F/U  Amount and/or Complexity of Data Reviewed Labs: ordered. Decision-making details documented in ED Course. Radiology: ordered.            Provider Communication  New Prescriptions   No medications on file    Modified Medications   No medications on file    Discontinued Medications   No medications on file    Clinical Impression Final diagnoses:  Vaginal bleeding in pregnancy (*)    ED Disposition     ED Disposition  Discharge   Condition  Stable   Comment  --                 Follow-up Information     SEE YOUR OB/GYN THIS WEEK.                   Electronically signed by:       [1] Social History Tobacco Use  Smoking Status Never  . Passive exposure: Never  Smokeless Tobacco Never  [2] No Known Allergies  Grayce HERO Ada, MD 10/05/23 [phone]

## 2023-10-05 NOTE — Telephone Encounter (Signed)
 RN completed follow up call with patient, [facility] ED visit due to vaginal bleeding. Pt reported bleeding subsided, almost gone, no cramping, reviewed when to return to hospital, scheduled for follow up with provider for 10/06/23.  Silvano LELON Piano, RN

## 2023-10-06 ENCOUNTER — Encounter: Admitting: Obstetrics and Gynecology

## 2023-10-06 ENCOUNTER — Ambulatory Visit (INDEPENDENT_AMBULATORY_CARE_PROVIDER_SITE_OTHER): Admitting: Obstetrics and Gynecology

## 2023-10-06 VITALS — BP 104/69 | HR 83 | Wt 184.0 lb

## 2023-10-06 DIAGNOSIS — O209 Hemorrhage in early pregnancy, unspecified: Secondary | ICD-10-CM

## 2023-10-06 DIAGNOSIS — Z3A12 12 weeks gestation of pregnancy: Secondary | ICD-10-CM | POA: Diagnosis not present

## 2023-10-06 DIAGNOSIS — Z3480 Encounter for supervision of other normal pregnancy, unspecified trimester: Secondary | ICD-10-CM | POA: Diagnosis not present

## 2023-10-06 MED ORDER — VITAMIN B-6 50 MG PO TABS
50.0000 mg | ORAL_TABLET | Freq: Three times a day (TID) | ORAL | 3 refills | Status: DC
Start: 2023-10-06 — End: 2023-12-15

## 2023-10-06 MED ORDER — DOXYLAMINE SUCCINATE (SLEEP) 25 MG PO TABS: 25.0000 mg | ORAL_TABLET | Freq: Every evening | ORAL | 3 refills | Status: DC

## 2023-10-06 NOTE — Progress Notes (Signed)
   OB PROBLEM VISIT   Subjective:  Elizabeth Perkins is a 38 y.o. G3P1011 at [redacted]w[redacted]d presenting for ED follow up.   Went in for bleeding 8/19. Hgb 12.5, Rh+, normal SLIUP on US . No SCH noted on US  report. Pelvic rest recommended by ED provider.   Today, reports bleeding has resolved. No pain, feeling well. Notes persistent nausea and requests rx, not currently taking anything  Objective:   Vitals:   10/06/23 0902  BP: 104/69  Pulse: 83  Weight: 184 lb (83.5 kg)    General:  Alert, oriented and cooperative. Patient is in no acute distress.  Skin: Skin is warm and dry. No rash noted.   Cardiovascular: Normal heart rate noted  Respiratory: Normal respiratory effort, no problems with respiration noted   FHR 148  Assessment and Plan:  Elizabeth Perkins is a 38 y.o. with vaginal bleeding in first trimester  Reassurance provided Recommended presenting to MAU if severe pain, fevers, or HVB (soaking pads in <1h). If light bleeding, can call and we can set up appt in office Rx for B6/unisom   Future Appointments  Date Time Provider Department Center  10/18/2023  3:50 PM Rasch, Delon FERNS, NP [facility]  11/25/2023 10:00 AM [facility] PROVIDER 1 [facility]  11/25/2023 10:30 AM [facility] US1 [facility]   Elizabeth JAYSON Carolin, MD

## 2023-10-12 LAB — PANORAMA PRENATAL TEST FULL PANEL:PANORAMA TEST PLUS 5 ADDITIONAL MICRODELETIONS: FETAL FRACTION: 9.8

## 2023-10-18 ENCOUNTER — Ambulatory Visit (INDEPENDENT_AMBULATORY_CARE_PROVIDER_SITE_OTHER): Admitting: Obstetrics and Gynecology

## 2023-10-18 VITALS — BP 114/72 | HR 88 | Wt 189.0 lb

## 2023-10-18 DIAGNOSIS — Z98891 History of uterine scar from previous surgery: Secondary | ICD-10-CM

## 2023-10-18 DIAGNOSIS — Z3A14 14 weeks gestation of pregnancy: Secondary | ICD-10-CM | POA: Diagnosis not present

## 2023-10-18 DIAGNOSIS — O09519 Supervision of elderly primigravida, unspecified trimester: Secondary | ICD-10-CM

## 2023-10-18 DIAGNOSIS — O09512 Supervision of elderly primigravida, second trimester: Secondary | ICD-10-CM | POA: Diagnosis not present

## 2023-10-18 DIAGNOSIS — Z3480 Encounter for supervision of other normal pregnancy, unspecified trimester: Secondary | ICD-10-CM

## 2023-10-18 MED ORDER — ONDANSETRON 4 MG PO TBDP: 4.0000 mg | ORAL_TABLET | Freq: Three times a day (TID) | ORAL | 0 refills | Status: DC | PRN

## 2023-10-18 NOTE — Progress Notes (Signed)
   PRENATAL VISIT NOTE  Subjective:  Elizabeth Perkins is a 38 y.o. G3P1011 at [redacted]w[redacted]d being seen today for ongoing prenatal care.  She is currently monitored for the following issues for this low-risk pregnancy and has AMA (advanced maternal age) primigravida 35+, unspecified trimester; Obesity affecting pregnancy; Supervision of other normal pregnancy, antepartum; and H/O: C-section on their problem list.  Patient reports no complaints.  Contractions: Not present. Vag. Bleeding: None.  Movement: Absent. Denies leaking of fluid.   The following portions of the patient's history were reviewed and updated as appropriate: allergies, current medications, past family history, past medical history, past social history, past surgical history and problem list.   Objective:    Vitals:   10/18/23 1557  BP: 114/72  Pulse: 88  Weight: 189 lb (85.7 kg)    Fetal Status:  Fetal Heart Rate (bpm): 143   Movement: Absent    General: Alert, oriented and cooperative. Patient is in no acute distress.  Skin: Skin is warm and dry. No rash noted.   Cardiovascular: Normal heart rate noted  Respiratory: Normal respiratory effort, no problems with respiration noted  Abdomen: Soft, gravid, appropriate for gestational age.  Pain/Pressure: Absent     Pelvic: Cervical exam deferred        Extremities: Normal range of motion.  Edema: None  Mental Status: Normal mood and affect. Normal behavior. Normal judgment and thought content.   Assessment and Plan:  Pregnancy: G3P1011 at [redacted]w[redacted]d  1. AMA (advanced maternal age) primigravida 35+, unspecified trimester (Primary)  Continue BASA   2. Supervision of other normal pregnancy, antepartum  Doing well Continue nausea, would like to try Zofran .  Rx sent.   3. H/O: C-section  Considering repeat C/s Still considering all options.   Preterm labor symptoms and general obstetric precautions including but not limited to vaginal bleeding, contractions, leaking of fluid  and fetal movement were reviewed in detail with the patient. Please refer to After Visit Summary for other counseling recommendations.   Return 4 week follow up.  Future Appointments  Date Time Provider Department Center  11/25/2023 10:00 AM [facility] PROVIDER 1 [facility]  11/25/2023 10:30 AM [facility] US1 [facility]    Delon Emms, NP

## 2023-11-09 ENCOUNTER — Other Ambulatory Visit: Payer: Self-pay

## 2023-11-09 DIAGNOSIS — O219 Vomiting of pregnancy, unspecified: Secondary | ICD-10-CM

## 2023-11-09 MED ORDER — ONDANSETRON 4 MG PO TBDP: 4.0000 mg | ORAL_TABLET | Freq: Three times a day (TID) | ORAL | 0 refills | Status: DC | PRN

## 2023-11-14 ENCOUNTER — Ambulatory Visit: Admitting: Obstetrics & Gynecology

## 2023-11-14 VITALS — BP 103/65 | HR 74 | Wt 183.0 lb

## 2023-11-14 DIAGNOSIS — Z98891 History of uterine scar from previous surgery: Secondary | ICD-10-CM | POA: Diagnosis not present

## 2023-11-14 DIAGNOSIS — O09512 Supervision of elderly primigravida, second trimester: Secondary | ICD-10-CM | POA: Diagnosis not present

## 2023-11-14 DIAGNOSIS — O09519 Supervision of elderly primigravida, unspecified trimester: Secondary | ICD-10-CM

## 2023-11-14 DIAGNOSIS — Z9889 Other specified postprocedural states: Secondary | ICD-10-CM

## 2023-11-14 DIAGNOSIS — Z3A18 18 weeks gestation of pregnancy: Secondary | ICD-10-CM

## 2023-11-14 DIAGNOSIS — Z3480 Encounter for supervision of other normal pregnancy, unspecified trimester: Secondary | ICD-10-CM

## 2023-11-14 DIAGNOSIS — O3442 Maternal care for other abnormalities of cervix, second trimester: Secondary | ICD-10-CM

## 2023-11-14 NOTE — Progress Notes (Signed)
   PRENATAL VISIT NOTE  Subjective:  Elizabeth Perkins is a 38 y.o. G3P1011 at [redacted]w[redacted]d being seen today for ongoing prenatal care.  She is currently monitored for the following issues for this high-risk pregnancy and has AMA (advanced maternal age) primigravida 35+, unspecified trimester; Obesity affecting pregnancy; Supervision of other normal pregnancy, antepartum; and H/O: C-section on their problem list.  Patient reports no complaints.  Contractions: Not present. Vag. Bleeding: None.  Movement: Present. Denies leaking of fluid.   The following portions of the patient's history were reviewed and updated as appropriate: allergies, current medications, past family history, past medical history, past social history, past surgical history and problem list.   Objective:    Vitals:   11/14/23 1402  BP: 103/65  Pulse: 74  Weight: 183 lb (83 kg)    Fetal Status:  Fetal Heart Rate (bpm): 143   Movement: Present    General: Alert, oriented and cooperative. Patient is in no acute distress.  Skin: Skin is warm and dry. No rash noted.   Cardiovascular: Normal heart rate noted  Respiratory: Normal respiratory effort, no problems with respiration noted  Abdomen: Soft, gravid, appropriate for gestational age.  Pain/Pressure: Absent     Pelvic: Cervical exam deferred        Extremities: Normal range of motion.  Edema: None  Mental Status: Normal mood and affect. Normal behavior. Normal judgment and thought content.   Assessment and Plan:  Pregnancy: G3P1011 at [redacted]w[redacted]d 1. Supervision of other normal pregnancy, antepartum (Primary) Nausea improving.   2. AMA (advanced maternal age) primigravida 35+, unspecified trimester Declines AFP Anatomy US  scheduled--cervix to be measured given hx of LEEP  3. H/O: C-section Pt desires rpt c/s  Preterm labor symptoms and general obstetric precautions including but not limited to vaginal bleeding, contractions, leaking of fluid and fetal movement were  reviewed in detail with the patient. Please refer to After Visit Summary for other counseling recommendations.   No follow-ups on file.  Future Appointments  Date Time Provider Department Center  11/25/2023 10:00 AM [facility] PROVIDER 1 [facility]  11/25/2023 10:30 AM [facility] US1 [facility]    Burnard Pate, MD

## 2023-11-25 ENCOUNTER — Ambulatory Visit: Admitting: Maternal & Fetal Medicine

## 2023-11-25 ENCOUNTER — Other Ambulatory Visit: Payer: Self-pay | Admitting: *Deleted

## 2023-11-25 ENCOUNTER — Ambulatory Visit: Attending: Obstetrics and Gynecology

## 2023-11-25 VITALS — BP 112/46

## 2023-11-25 DIAGNOSIS — Z368A Encounter for antenatal screening for other genetic defects: Secondary | ICD-10-CM | POA: Insufficient documentation

## 2023-11-25 DIAGNOSIS — O09899 Supervision of other high risk pregnancies, unspecified trimester: Secondary | ICD-10-CM | POA: Insufficient documentation

## 2023-11-25 DIAGNOSIS — O35BXX0 Maternal care for other (suspected) fetal abnormality and damage, fetal cardiac anomalies, not applicable or unspecified: Secondary | ICD-10-CM | POA: Diagnosis not present

## 2023-11-25 DIAGNOSIS — O3440 Maternal care for other abnormalities of cervix, unspecified trimester: Secondary | ICD-10-CM | POA: Diagnosis not present

## 2023-11-25 DIAGNOSIS — Z98891 History of uterine scar from previous surgery: Secondary | ICD-10-CM | POA: Diagnosis not present

## 2023-11-25 DIAGNOSIS — Z3A20 20 weeks gestation of pregnancy: Secondary | ICD-10-CM | POA: Diagnosis not present

## 2023-11-25 DIAGNOSIS — O09519 Supervision of elderly primigravida, unspecified trimester: Secondary | ICD-10-CM

## 2023-11-25 DIAGNOSIS — Z3480 Encounter for supervision of other normal pregnancy, unspecified trimester: Secondary | ICD-10-CM | POA: Insufficient documentation

## 2023-11-25 DIAGNOSIS — O09522 Supervision of elderly multigravida, second trimester: Secondary | ICD-10-CM

## 2023-11-25 DIAGNOSIS — Z141 Cystic fibrosis carrier: Secondary | ICD-10-CM | POA: Diagnosis not present

## 2023-11-25 DIAGNOSIS — O99212 Obesity complicating pregnancy, second trimester: Secondary | ICD-10-CM | POA: Insufficient documentation

## 2023-11-25 DIAGNOSIS — Z9889 Other specified postprocedural states: Secondary | ICD-10-CM

## 2023-11-25 DIAGNOSIS — O99282 Endocrine, nutritional and metabolic diseases complicating pregnancy, second trimester: Secondary | ICD-10-CM | POA: Diagnosis not present

## 2023-11-25 DIAGNOSIS — O34219 Maternal care for unspecified type scar from previous cesarean delivery: Secondary | ICD-10-CM | POA: Insufficient documentation

## 2023-11-25 DIAGNOSIS — O283 Abnormal ultrasonic finding on antenatal screening of mother: Secondary | ICD-10-CM | POA: Diagnosis not present

## 2023-11-25 DIAGNOSIS — O09523 Supervision of elderly multigravida, third trimester: Secondary | ICD-10-CM | POA: Diagnosis not present

## 2023-11-25 DIAGNOSIS — O358XX0 Maternal care for other (suspected) fetal abnormality and damage, not applicable or unspecified: Secondary | ICD-10-CM

## 2023-11-25 NOTE — Progress Notes (Signed)
 Patient information  Patient Name: Elizabeth Perkins  Patient MRN:   [id number]  Referring practice: MFM Referring Provider: [facility]  Problem List   Patient Active Problem List   Diagnosis Date Noted   Fetal echogenic intracardiac focus on prenatal ultrasound 11/25/2023   History of cervical LEEP biopsy affecting care of mother, antepartum 11/14/2023   Supervision of other high risk pregnancy, antepartum 09/26/2023   H/O: C-section 09/26/2023   Obesity affecting pregnancy 06/02/2022   AMA (advanced maternal age) primigravida 35+, unspecified trimester 04/24/2020    Maternal Fetal Medicine Consult Brynlyn SHAKEDRA BEAM is a 38 y.o. G3P1011 at [redacted]w[redacted]d here for ultrasound and consultation. She had low risk aneuploidy screening of a female fetus. Carrier screening was CF carrier (FOB negative - this reduces the chance of the couple having a child with CF to 1 in 10,800 (0.009%).. Maternal serum AFP n/a. She has no acute concerns.   Today we focused on the following:   Echogenic intracardiac focus An intracardiac echogenic focus was noted on today's ultrasound. This is thought to represent a calcification of the ventricular papillary muscles.  There is no adverse cardiac function associated with this finding.  This is seen in about 3% to 4% of normal pregnancies but is higher in a pregnancy affected by Down Syndrome. Historically there was association with aneuploidy but in the presence of normal aneuploidy screening this is considered a normal variant. I reassured the patient that the likelihood of Down syndrome is extremely low (<1/10,000) based on her NIPT testing.   AMA: She will be 38 years old at the EDD. NIPT was low risk.  We discussed the potential increased risk of gestational diabetes, gestational hypertension or preeclampsia, preterm birth and fetal growth abnormalities as well as the need for serial growth ultrasounds and antenatal testing.  Hx of LEEP: Cervical  length is normal. No further workup.   Hx of CD x1 (arrest of dilation around 10 cm): The patient reports that she was told she has a narrow pelvis.  She desires a repeat C-section.  Cystic fibrosis carrier: The patient has had her husband tested and he is negative.  This gives them the risk of approximately 1 out of 10,800 of having an affected fetus.  I reassured the patient that we expect a favorable pregnancy outcome but due to her pregnancy complications she will need a higher level of monitoring for her pregnancy compared to a pregnancy without complications. The patient had time to ask questions that were answered to her satisfaction. She verbalized understanding of our discussion and agreed to proceed with the plan outlined in the recommendations.   Sonographic findings Single intrauterine pregnancy at 20w 0d.  Fetal cardiac activity:  Observed and appears normal. Presentation: Transverse, head to maternal left. Interval fetal anatomy appears normal. Fetal biometry shows the estimated fetal weight at the 60 percentile. Amniotic fluid volume: Within normal limits. MVP: 3. cm. Placenta: Posterior Fundal.  Recommendations - Aneuploidy screening was completed at the [facility] office - Anatomy ultrasound was done today with the above findings (see report). - Aspirin  81-162 mg continued throughout the pregnancy for preeclampsia prophylaxis. - Baseline labs: CMP, CBC, urine protein creatinine ratio if not previously completed. Repeat with any concerns for preeclampsia. - Blood pressure goal of < 140 systolic and < 90 diastolic. Antihypertensive medication should be added/adjusted until BP goal is achieved.  - Serial growth ultrasounds every 4 weeks starting at 24 to 28 weeks until delivery. - Antenatal  testing (usually weekly BPP or NST) weekly at 36 weeks until delivery due to [facility] greater than 40 - Delivery likely around [redacted] weeks gestation or sooner if indicated via repeat C-section  Review of  Systems: A review of systems was performed and was negative except per HPI   Past Obstetrical History:  OB History  Gravida Para Term Preterm AB Living  3 1 1  1 1   SAB IAB Ectopic Multiple Live Births     0 1    # Outcome Date GA Lbr Len/2nd Weight Sex Type Anes PTL Lv  3 Current           2 AB 06/2022          1 Term 10/03/20 [redacted]w[redacted]d 14:53 / 04:30 6 lb 14.1 oz (3.121 kg) F CS-LTranv EPI  LIV     Birth Comments: WNL     Past Medical History:  Past Medical History:  Diagnosis Date   Abnormal Pap smear of cervix    HGSIL 2019 Never followed up   Cystic fibrosis gene carrier 04/14/2020   FOB has negative CF testing.    HGSIL (high grade squamous intraepithelial lesion) on Pap smear of cervix 12/15/2016   Formatting of this note is different from the original.  ASCUS/HPV+ on 12-02-16  02/2017: CIN 1  Needs cotesting in 11/2017   HPV in female    Plantar fasciitis, right    Vaginal Pap smear, abnormal    ASCUS   Vitamin D deficiency      Past Surgical History:    Past Surgical History:  Procedure Laterality Date   CESAREAN SECTION N/A 10/03/2020   Procedure: CESAREAN SECTION;  Surgeon: Zina Jerilynn LABOR, MD;  Location: [facility] LD [facility]S;  Service: Obstetrics;  Laterality: N/A;   COLPOSCOPY       Home Medications:   Current Outpatient Medications on File Prior to Visit  Medication Sig Dispense Refill   acetaminophen  (TYLENOL ) 500 MG tablet Take 500 mg by mouth every 6 (six) hours as needed.     aspirin  EC 81 MG tablet Take 1 tablet (81 mg total) by mouth at bedtime. Start taking when you are [redacted] weeks pregnant for rest of pregnancy for prevention of preeclampsia 300 tablet 2   ondansetron  (ZOFRAN -ODT) 4 MG disintegrating tablet Take 1 tablet (4 mg total) by mouth every 8 (eight) hours as needed for nausea or vomiting. 20 tablet 0   Prenatal Vit-Fe Fumarate-FA (PRENATAL PO) Take by mouth daily.     doxylamine , Sleep, (UNISOM ) 25 MG tablet Take 1 tablet (25 mg total) by mouth at bedtime.  (Patient not taking: Reported on 11/25/2023) 30 tablet 3   pyridOXINE  (VITAMIN B6) 50 MG tablet Take 1 tablet (50 mg total) by mouth in the morning, at noon, and at bedtime. (Patient not taking: Reported on 11/25/2023) 90 tablet 3   No current facility-administered medications on file prior to visit.      Allergies:   No Known Allergies   Physical Exam:   Vitals:   11/25/23 0955  BP: (!) 112/46   Sitting comfortably on the sonogram table Nonlabored breathing Normal rate and rhythm Abdomen is nontender  Thank you for the opportunity to be involved with this patient's care. Please let us  know if we can be of any further assistance.   60 minutes of time was spent reviewing the patient's chart including labs, imaging and documentation.  At least 50% of this time was spent with direct patient care discussing the  diagnosis, management and prognosis of her care.  Delora Smaller MFM, [place]   11/25/2023  12:20 PM

## 2023-12-15 NOTE — Progress Notes (Signed)
   PRENATAL VISIT NOTE  Subjective:  Elizabeth Perkins is a 38 y.o. G3P1011 at [redacted]w[redacted]d being seen today for ongoing prenatal care.  She is currently monitored for the following issues for this high-risk pregnancy and has AMA (advanced maternal age) primigravida 35+, unspecified trimester; Obesity affecting pregnancy; Supervision of other high risk pregnancy, antepartum; H/O: C-section; History of cervical LEEP biopsy affecting care of mother, antepartum; Fetal echogenic intracardiac focus on prenatal ultrasound; and HSIL (high grade squamous intraepithelial lesion) on Pap smear of cervix on their problem list.  Patient reports no complaints.   . Vag. Bleeding: None.  Movement: Present. Denies leaking of fluid.   The following portions of the patient's history were reviewed and updated as appropriate: allergies, current medications, past family history, past medical history, past social history, past surgical history and problem list.   Objective:    Vitals:   12/19/23 1106  BP: 98/63  Pulse: 84  Weight: 191 lb (86.6 kg)    Fetal Status:  Fetal Heart Rate (bpm): 149   Movement: Present    General: Alert, oriented and cooperative. Patient is in no acute distress.  Skin: Skin is warm and dry. No rash noted.   Cardiovascular: Normal heart rate noted  Respiratory: Normal respiratory effort, no problems with respiration noted  Abdomen: Soft, gravid, appropriate for gestational age.  Pain/Pressure: Absent     Pelvic: Cervical exam deferred        Extremities: Normal range of motion.  Edema: None  Mental Status: Normal mood and affect. Normal behavior. Normal judgment and thought content.   Assessment and Plan:  Pregnancy: G3P1011 at [redacted]w[redacted]d 1. Supervision of other high risk pregnancy, antepartum (Primary) Discussed flu shot. She had it through work [employer]) Anatomy wnl but incomplete - has f/u scheduled.   2. Pregnancy with 23 completed weeks gestation  3. Fetal echogenic  intracardiac focus on prenatal ultrasound LR NIPS  4. Obesity affecting pregnancy in first trimester, unspecified obesity type  5. History of cervical LEEP biopsy affecting care of mother, antepartum Normal CL on anatomy US   6. H/O: C-section Plan is for RLTCS at 39w. She would like tubes removed entirely at the time of c-section. She is sure she is done having children.   Preterm labor symptoms and general obstetric precautions including but not limited to vaginal bleeding, contractions, leaking of fluid and fetal movement were reviewed in detail with the patient. Please refer to After Visit Summary for other counseling recommendations.   Return in about 4 weeks (around 01/16/2024) for OB VISIT, MD or APP.  Future Appointments  Date Time Provider Department Center  12/30/2023  3:15 PM [facility] PROVIDER 1 [facility]  12/30/2023  3:30 PM [facility] US1 [facility]  01/09/2024  8:30 AM [facility] NURSE [facility]  01/09/2024 11:10 AM Cris Burnard DEL, MD [facility]  02/03/2024 10:15 AM [facility] PROVIDER 1 [facility]  02/03/2024 10:30 AM [facility] US4 [facility]    Vina Solian, MD

## 2023-12-19 ENCOUNTER — Ambulatory Visit (INDEPENDENT_AMBULATORY_CARE_PROVIDER_SITE_OTHER): Admitting: Obstetrics and Gynecology

## 2023-12-19 VITALS — BP 98/63 | HR 84 | Wt 191.0 lb

## 2023-12-19 DIAGNOSIS — Z3A23 23 weeks gestation of pregnancy: Secondary | ICD-10-CM | POA: Diagnosis not present

## 2023-12-19 DIAGNOSIS — O3440 Maternal care for other abnormalities of cervix, unspecified trimester: Secondary | ICD-10-CM | POA: Diagnosis not present

## 2023-12-19 DIAGNOSIS — R87613 High grade squamous intraepithelial lesion on cytologic smear of cervix (HGSIL): Secondary | ICD-10-CM

## 2023-12-19 DIAGNOSIS — Z3009 Encounter for other general counseling and advice on contraception: Secondary | ICD-10-CM

## 2023-12-19 DIAGNOSIS — O99211 Obesity complicating pregnancy, first trimester: Secondary | ICD-10-CM

## 2023-12-19 DIAGNOSIS — O09899 Supervision of other high risk pregnancies, unspecified trimester: Secondary | ICD-10-CM

## 2023-12-19 DIAGNOSIS — Z98891 History of uterine scar from previous surgery: Secondary | ICD-10-CM

## 2023-12-19 DIAGNOSIS — O283 Abnormal ultrasonic finding on antenatal screening of mother: Secondary | ICD-10-CM

## 2023-12-19 DIAGNOSIS — Z9889 Other specified postprocedural states: Secondary | ICD-10-CM

## 2023-12-27 ENCOUNTER — Ambulatory Visit

## 2023-12-30 ENCOUNTER — Ambulatory Visit (HOSPITAL_BASED_OUTPATIENT_CLINIC_OR_DEPARTMENT_OTHER): Admitting: Obstetrics

## 2023-12-30 ENCOUNTER — Ambulatory Visit: Attending: Maternal & Fetal Medicine

## 2023-12-30 DIAGNOSIS — O09522 Supervision of elderly multigravida, second trimester: Secondary | ICD-10-CM | POA: Insufficient documentation

## 2023-12-30 DIAGNOSIS — O283 Abnormal ultrasonic finding on antenatal screening of mother: Secondary | ICD-10-CM

## 2023-12-30 DIAGNOSIS — Z3A25 25 weeks gestation of pregnancy: Secondary | ICD-10-CM | POA: Diagnosis not present

## 2023-12-30 DIAGNOSIS — O3440 Maternal care for other abnormalities of cervix, unspecified trimester: Secondary | ICD-10-CM

## 2023-12-30 DIAGNOSIS — O34219 Maternal care for unspecified type scar from previous cesarean delivery: Secondary | ICD-10-CM | POA: Diagnosis not present

## 2023-12-30 DIAGNOSIS — O99212 Obesity complicating pregnancy, second trimester: Secondary | ICD-10-CM | POA: Diagnosis not present

## 2023-12-30 DIAGNOSIS — O358XX0 Maternal care for other (suspected) fetal abnormality and damage, not applicable or unspecified: Secondary | ICD-10-CM | POA: Diagnosis not present

## 2023-12-30 DIAGNOSIS — Z141 Cystic fibrosis carrier: Secondary | ICD-10-CM | POA: Diagnosis not present

## 2023-12-30 DIAGNOSIS — E669 Obesity, unspecified: Secondary | ICD-10-CM | POA: Diagnosis not present

## 2023-12-30 DIAGNOSIS — Z9889 Other specified postprocedural states: Secondary | ICD-10-CM | POA: Diagnosis not present

## 2023-12-30 NOTE — Progress Notes (Signed)
 MFM Consult Note  Elizabeth Perkins is currently at 25 weeks and 0 days.  She has been followed due to advanced maternal age (38 years old) and maternal obesity.    She denies any problems since her last exam.  Her blood pressure today was 107/52.    Sonographic findings Single intrauterine pregnancy at 25w 0d.  Fetal cardiac activity:  Observed and appears normal. Presentation: Breech. Fetal biometry shows the estimated fetal weight of 1 pound 14 ounces which measures at the 72nd percentile. Amniotic fluid volume: Within normal limits. MVP: 5.46 cm. Placenta: Posterior.  There are limitations of prenatal ultrasound such as the inability to detect certain abnormalities due to poor visualization. Various factors such as fetal position, gestational age and maternal body habitus may increase the difficulty in visualizing the fetal anatomy.    Due to advanced maternal age, we will continue to follow her with growth ultrasounds throughout her pregnancy.    Weekly fetal testing should be started at 34 weeks and continued until delivery.    Delivery should be considered at around 39 weeks.  She will return in 4 weeks for another growth scan.    The patient stated that all of her questions were answered today.  A total of 20 minutes was spent counseling and coordinating the care for this patient.  Greater than 50% of the time was spent in direct face-to-face contact.

## 2024-01-09 ENCOUNTER — Ambulatory Visit (INDEPENDENT_AMBULATORY_CARE_PROVIDER_SITE_OTHER): Admitting: Obstetrics & Gynecology

## 2024-01-09 ENCOUNTER — Other Ambulatory Visit

## 2024-01-09 VITALS — BP 97/60 | HR 75 | Wt 192.0 lb

## 2024-01-09 DIAGNOSIS — O09519 Supervision of elderly primigravida, unspecified trimester: Secondary | ICD-10-CM

## 2024-01-09 DIAGNOSIS — Z23 Encounter for immunization: Secondary | ICD-10-CM | POA: Diagnosis not present

## 2024-01-09 DIAGNOSIS — O09892 Supervision of other high risk pregnancies, second trimester: Secondary | ICD-10-CM

## 2024-01-09 DIAGNOSIS — O09899 Supervision of other high risk pregnancies, unspecified trimester: Secondary | ICD-10-CM

## 2024-01-09 DIAGNOSIS — R87613 High grade squamous intraepithelial lesion on cytologic smear of cervix (HGSIL): Secondary | ICD-10-CM

## 2024-01-09 DIAGNOSIS — Z3A26 26 weeks gestation of pregnancy: Secondary | ICD-10-CM | POA: Diagnosis not present

## 2024-01-09 NOTE — Progress Notes (Unsigned)
   PRENATAL VISIT NOTE  Subjective:  Elizabeth Perkins is a 38 y.o. G3P1011 at [redacted]w[redacted]d being seen today for ongoing prenatal care.  She is currently monitored for the following issues for this {Blank single:19197::high-risk,low-risk} pregnancy and has AMA (advanced maternal age) primigravida 35+, unspecified trimester; Obesity affecting pregnancy; Supervision of other high risk pregnancy, antepartum; H/O: C-section; History of cervical LEEP biopsy affecting care of mother, antepartum; Fetal echogenic intracardiac focus on prenatal ultrasound; HSIL (high grade squamous intraepithelial lesion) on Pap smear of cervix; and Unwanted fertility on their problem list.  Patient reports {[id number]}.  Contractions: Not present. Vag. Bleeding: None.  Movement: Absent. Denies leaking of fluid.   The following portions of the patient's history were reviewed and updated as appropriate: allergies, current medications, past family history, past medical history, past social history, past surgical history and problem list.   Objective:   Vitals:   01/09/24 0909  BP: 97/60  Pulse: 75  Weight: 192 lb (87.1 kg)    Fetal Status:  Fetal Heart Rate (bpm): 145   Movement: Absent    General: Alert, oriented and cooperative. Patient is in no acute distress.  Skin: Skin is warm and dry. No rash noted.   Cardiovascular: Normal heart rate noted  Respiratory: Normal respiratory effort, no problems with respiration noted  Abdomen: Soft, gravid, appropriate for gestational age.  Pain/Pressure: Absent     Pelvic: Cervical exam deferred        Extremities: Normal range of motion.  Edema: None  Mental Status: Normal mood and affect. Normal behavior. Normal judgment and thought content.       No data to display               No data to display          Assessment and Plan:  Pregnancy: G3P1011 at [redacted]w[redacted]d 1. [redacted] weeks gestation of pregnancy (Primary) - Glucose Tolerance, 2 Hours w/1 Hour - CBC - HIV Antibody  (routine testing w rflx) - RPR  2. Supervision of other high risk pregnancy, antepartum - Glucose Tolerance, 2 Hours w/1 Hour - CBC - HIV Antibody (routine testing w rflx) - RPR - Tdap  3. AMA (advanced maternal age) primigravida 35+, unspecified trimester Weekly fetal testing should be started at 34 weeks and continued until delivery.     Delivery should be considered at around 39 weeks.   She will return in 4 weeks for another growth scan.    Preterm labor symptoms and general obstetric precautions including but not limited to vaginal bleeding, contractions, leaking of fluid and fetal movement were reviewed in detail with the patient. Please refer to After Visit Summary for other counseling recommendations.   No follow-ups on file.  Future Appointments  Date Time Provider Department Center  01/09/2024 11:10 AM Cris, Burnard DEL, MD [facility]    Burnard Cris, MD

## 2024-01-10 LAB — CBC
Hematocrit: 32.5 % — ABNORMAL LOW (ref 34.0–46.6)
Hemoglobin: 10.6 g/dL — ABNORMAL LOW (ref 11.1–15.9)
MCH: 26.8 pg (ref 26.6–33.0)
MCHC: 32.6 g/dL (ref 31.5–35.7)
MCV: 82 fL (ref 79–97)
Platelets: 316 x10E3/uL (ref 150–450)
RBC: 3.96 x10E6/uL (ref 3.77–5.28)
RDW: 13.6 % (ref 11.7–15.4)
WBC: 9.7 x10E3/uL (ref 3.4–10.8)

## 2024-01-10 LAB — SYPHILIS: RPR W/REFLEX TO RPR TITER AND TREPONEMAL ANTIBODIES, TRADITIONAL SCREENING AND DIAGNOSIS ALGORITHM: RPR Ser Ql: NONREACTIVE

## 2024-01-10 LAB — GLUCOSE TOLERANCE, 2 HOURS W/ 1HR
Glucose, 1 hour: 130 mg/dL (ref 70–179)
Glucose, 2 hour: 98 mg/dL (ref 70–152)
Glucose, Fasting: 87 mg/dL (ref 70–91)

## 2024-01-10 LAB — HIV ANTIBODY (ROUTINE TESTING W REFLEX): HIV Screen 4th Generation wRfx: NONREACTIVE

## 2024-01-12 ENCOUNTER — Ambulatory Visit: Payer: Self-pay | Admitting: Obstetrics & Gynecology

## 2024-01-17 LAB — FERRITIN: Ferritin: 6 ng/mL — ABNORMAL LOW (ref 15–150)

## 2024-01-17 LAB — SPECIMEN STATUS REPORT

## 2024-01-18 MED ORDER — FERROUS SULFATE 325 (65 FE) MG PO TABS: ORAL_TABLET | ORAL | 3 refills | Status: AC

## 2024-01-23 ENCOUNTER — Other Ambulatory Visit: Payer: Self-pay

## 2024-01-23 ENCOUNTER — Ambulatory Visit: Admitting: Obstetrics & Gynecology

## 2024-01-23 VITALS — BP 106/67 | HR 82 | Wt 193.0 lb

## 2024-01-23 DIAGNOSIS — Z98891 History of uterine scar from previous surgery: Secondary | ICD-10-CM

## 2024-01-23 DIAGNOSIS — O09899 Supervision of other high risk pregnancies, unspecified trimester: Secondary | ICD-10-CM

## 2024-01-23 DIAGNOSIS — O283 Abnormal ultrasonic finding on antenatal screening of mother: Secondary | ICD-10-CM

## 2024-01-23 DIAGNOSIS — O09519 Supervision of elderly primigravida, unspecified trimester: Secondary | ICD-10-CM

## 2024-01-23 DIAGNOSIS — O219 Vomiting of pregnancy, unspecified: Secondary | ICD-10-CM

## 2024-01-23 MED ORDER — ONDANSETRON 4 MG PO TBDP: 4.0000 mg | ORAL_TABLET | Freq: Three times a day (TID) | ORAL | 3 refills | Status: AC | PRN

## 2024-01-23 NOTE — Progress Notes (Signed)
   PRENATAL VISIT NOTE  Subjective:  Elizabeth Perkins is a 38 y.o. G3P1011 at [redacted]w[redacted]d being seen today for ongoing prenatal care.  She is currently monitored for the following issues for this high-risk pregnancy and has AMA (advanced maternal age) primigravida 35+, unspecified trimester; Obesity affecting pregnancy; Supervision of other high risk pregnancy, antepartum; H/O: C-section; History of cervical LEEP biopsy affecting care of mother, antepartum; Fetal echogenic intracardiac focus on prenatal ultrasound; HSIL (high grade squamous intraepithelial lesion) on Pap smear of cervix; and Unwanted fertility on their problem list.  Patient reports no complaints.  Contractions: Not present. Vag. Bleeding: None.  Movement: Present. Denies leaking of fluid.   The following portions of the patient's history were reviewed and updated as appropriate: allergies, current medications, past family history, past medical history, past social history, past surgical history and problem list.   Objective:   Vitals:   01/23/24 1408  BP: 106/67  Pulse: 82  Weight: 193 lb (87.5 kg)    Fetal Status:  Fetal Heart Rate (bpm): 135   Movement: Present    General: Alert, oriented and cooperative. Patient is in no acute distress.  Skin: Skin is warm and dry. No rash noted.   Cardiovascular: Normal heart rate noted  Respiratory: Normal respiratory effort, no problems with respiration noted  Abdomen: Soft, gravid, appropriate for gestational age.  Pain/Pressure: Present     Pelvic: Cervical exam deferred        Extremities: Normal range of motion.  Edema: None  Mental Status: Normal mood and affect. Normal behavior. Normal judgment and thought content.       No data to display               No data to display          Assessment and Plan:  Pregnancy: G3P1011 at [redacted]w[redacted]d 1. AMA (advanced maternal age) primigravida 35+, unspecified trimester (Primary) Antenatal testing at 34 weeks, delivery at 39  weeks  2. Fetal echogenic intracardiac focus on prenatal ultrasound F/u US  with MFM  3. Supervision of other high risk pregnancy, antepartum Low ferritn--iron as prescribed (starting today)  4. H/O: C-section Rpt c/s with Bil salpingectomy  Preterm labor symptoms and general obstetric precautions including but not limited to vaginal bleeding, contractions, leaking of fluid and fetal movement were reviewed in detail with the patient. Please refer to After Visit Summary for other counseling recommendations.   No follow-ups on file.  Future Appointments  Date Time Provider Department Center  01/30/2024  3:15 PM [facility] PROVIDER 1 [facility]  01/30/2024  3:30 PM [facility] US3 [facility]    Burnard Pate, MD

## 2024-01-30 ENCOUNTER — Ambulatory Visit: Admitting: Obstetrics and Gynecology

## 2024-01-30 ENCOUNTER — Ambulatory Visit: Attending: Maternal & Fetal Medicine

## 2024-01-30 ENCOUNTER — Other Ambulatory Visit: Payer: Self-pay | Admitting: *Deleted

## 2024-01-30 VITALS — BP 112/56 | HR 85

## 2024-01-30 DIAGNOSIS — O358XX0 Maternal care for other (suspected) fetal abnormality and damage, not applicable or unspecified: Secondary | ICD-10-CM

## 2024-01-30 DIAGNOSIS — O09523 Supervision of elderly multigravida, third trimester: Secondary | ICD-10-CM | POA: Diagnosis not present

## 2024-01-30 DIAGNOSIS — O3443 Maternal care for other abnormalities of cervix, third trimester: Secondary | ICD-10-CM | POA: Diagnosis not present

## 2024-01-30 DIAGNOSIS — O3440 Maternal care for other abnormalities of cervix, unspecified trimester: Secondary | ICD-10-CM

## 2024-01-30 DIAGNOSIS — O34219 Maternal care for unspecified type scar from previous cesarean delivery: Secondary | ICD-10-CM

## 2024-01-30 DIAGNOSIS — O09522 Supervision of elderly multigravida, second trimester: Secondary | ICD-10-CM | POA: Diagnosis not present

## 2024-01-30 DIAGNOSIS — E669 Obesity, unspecified: Secondary | ICD-10-CM | POA: Diagnosis not present

## 2024-01-30 DIAGNOSIS — Z3A29 29 weeks gestation of pregnancy: Secondary | ICD-10-CM | POA: Insufficient documentation

## 2024-01-30 DIAGNOSIS — Z9889 Other specified postprocedural states: Secondary | ICD-10-CM | POA: Diagnosis not present

## 2024-01-30 DIAGNOSIS — O99213 Obesity complicating pregnancy, third trimester: Secondary | ICD-10-CM | POA: Diagnosis not present

## 2024-01-30 NOTE — Progress Notes (Signed)
 Maternal-Fetal Medicine Consultation  Name: KIMBLE DELAURENTIS  MRN: [id number]  GA: [id number] [redacted]w[redacted]d   Advanced maternal age (38 years). History of cesarean delivery (arrest of descent).  Patient does not have gestational diabetes. Blood pressure today at our office is 101/56 mmHg. Ultrasound Normal fetal growth and amniotic fluid.  Cephalic presentation.  Placenta is posterior and there is no evidence of previa or placenta accreta spectrum.  Incidentally observed BPP is 8/8. Previous cesarean delivery I reassured the patient of normal placental location and that there is no evidence of previa or placenta accreta spectrum.  I discussed the benefits and risks of vaginal birth after cesarean section (VBAC).  The risk of uterine scar dehiscence is about 1%.  Cesarean deliveries increase the risks of hemorrhage, infection and venous thromboembolism.  Repeat cesarean deliveries increase the risks of placenta previa and/or placenta accreta spectrum. Long-term complications include small bowel obstruction.  Based on [facility] calculator, the likelihood of successful vaginal delivery is 33%. Patient was counseled that this is only an approximate calculation.  Patient wants to have repeat cesarean delivery.  I discussed the importance of weekly antenatal testing because of advanced maternal age.  The absolute risk of stillbirth with advanced maternal age is very small.  Recommendations -Appointment was made for her to return 9 weeks for fetal growth assessment and BPP. -Weekly NSTs at your office from [redacted] weeks gestation. - Delivery at [redacted] weeks gestation.     Consultation including face-to-face (more than 50%) counseling 20 minutes.

## 2024-02-03 ENCOUNTER — Ambulatory Visit

## 2024-02-07 ENCOUNTER — Encounter: Payer: Self-pay | Admitting: Advanced Practice Midwife

## 2024-02-07 ENCOUNTER — Ambulatory Visit (INDEPENDENT_AMBULATORY_CARE_PROVIDER_SITE_OTHER): Admitting: Advanced Practice Midwife

## 2024-02-07 VITALS — BP 98/62 | HR 81 | Wt 196.8 lb

## 2024-02-07 DIAGNOSIS — O09513 Supervision of elderly primigravida, third trimester: Secondary | ICD-10-CM | POA: Diagnosis not present

## 2024-02-07 DIAGNOSIS — Z3A30 30 weeks gestation of pregnancy: Secondary | ICD-10-CM

## 2024-02-07 DIAGNOSIS — O09893 Supervision of other high risk pregnancies, third trimester: Secondary | ICD-10-CM | POA: Diagnosis not present

## 2024-02-07 DIAGNOSIS — O09899 Supervision of other high risk pregnancies, unspecified trimester: Secondary | ICD-10-CM

## 2024-02-07 DIAGNOSIS — O09519 Supervision of elderly primigravida, unspecified trimester: Secondary | ICD-10-CM

## 2024-02-07 DIAGNOSIS — Z98891 History of uterine scar from previous surgery: Secondary | ICD-10-CM

## 2024-02-07 NOTE — Progress Notes (Signed)
" ° °  PRENATAL VISIT NOTE  Subjective:  Elizabeth Perkins is a 38 y.o. G3P1011 at [redacted]w[redacted]d being seen today for ongoing prenatal care.  She is currently monitored for the following issues for this high-risk pregnancy and has AMA (advanced maternal age) primigravida 35+, unspecified trimester; Obesity affecting pregnancy; Supervision of other high risk pregnancy, antepartum; H/O: C-section; History of cervical LEEP biopsy affecting care of mother, antepartum; Fetal echogenic intracardiac focus on prenatal ultrasound; HSIL (high grade squamous intraepithelial lesion) on Pap smear of cervix; and Unwanted fertility on their problem list.  Patient reports no complaints.  Contractions: Not present. Vag. Bleeding: None.  Movement: Present. Denies leaking of fluid.   The following portions of the patient's history were reviewed and updated as appropriate: allergies, current medications, past family history, past medical history, past social history, past surgical history and problem list.   Objective:   Vitals:   02/07/24 1416  BP: 98/62  Pulse: 81  Weight: 196 lb 12.8 oz (89.3 kg)    Fetal Status:  Fetal Heart Rate (bpm): 130   Movement: Present    General: Alert, oriented and cooperative. Patient is in no acute distress.  Skin: Skin is warm and dry. No rash noted.   Cardiovascular: Normal heart rate noted  Respiratory: Normal respiratory effort, no problems with respiration noted  Abdomen: Soft, gravid, appropriate for gestational age.  Pain/Pressure: Present     Pelvic: Cervical exam deferred        Extremities: Normal range of motion.  Edema: None  Mental Status: Normal mood and affect. Normal behavior. Normal judgment and thought content.       No data to display               No data to display          Assessment and Plan:  Pregnancy: G3P1011 at [redacted]w[redacted]d 1. Supervision of other high risk pregnancy, antepartum (Primary) --Anticipatory guidance about next visits/weeks of pregnancy  given.   2. H/O: C-section --Desires repeat and BTL  3. AMA (advanced maternal age) primigravida 35+, unspecified trimester --Antenatal testing at 34 weeks per MFM  4. [redacted] weeks gestation of pregnancy   Preterm labor symptoms and general obstetric precautions including but not limited to vaginal bleeding, contractions, leaking of fluid and fetal movement were reviewed in detail with the patient. Please refer to After Visit Summary for other counseling recommendations.   No follow-ups on file.  Future Appointments  Date Time Provider Department Center  02/20/2024 11:10 AM Erik Kieth BROCKS, MD [facility]  03/08/2024  1:30 PM Abigail Rollo DASEN, MD [facility]  03/09/2024 10:15 AM [facility] PROVIDER 1 [facility]  03/09/2024 10:30 AM [facility] US4 [facility]    Olam Boards, CNM  "

## 2024-02-07 NOTE — Progress Notes (Signed)
 ROB.   Pt has no concerns today.

## 2024-02-20 ENCOUNTER — Ambulatory Visit (INDEPENDENT_AMBULATORY_CARE_PROVIDER_SITE_OTHER): Payer: Self-pay | Admitting: Obstetrics and Gynecology

## 2024-02-20 VITALS — BP 98/61 | HR 78 | Wt 196.0 lb

## 2024-02-20 DIAGNOSIS — O09519 Supervision of elderly primigravida, unspecified trimester: Secondary | ICD-10-CM | POA: Diagnosis not present

## 2024-02-20 DIAGNOSIS — Z98891 History of uterine scar from previous surgery: Secondary | ICD-10-CM | POA: Diagnosis not present

## 2024-02-20 DIAGNOSIS — Z9889 Other specified postprocedural states: Secondary | ICD-10-CM | POA: Diagnosis not present

## 2024-02-20 DIAGNOSIS — O09899 Supervision of other high risk pregnancies, unspecified trimester: Secondary | ICD-10-CM | POA: Diagnosis not present

## 2024-02-20 DIAGNOSIS — Z3A32 32 weeks gestation of pregnancy: Secondary | ICD-10-CM | POA: Diagnosis not present

## 2024-02-20 DIAGNOSIS — O283 Abnormal ultrasonic finding on antenatal screening of mother: Secondary | ICD-10-CM | POA: Diagnosis not present

## 2024-02-20 DIAGNOSIS — D508 Other iron deficiency anemias: Secondary | ICD-10-CM | POA: Diagnosis not present

## 2024-02-20 DIAGNOSIS — D509 Iron deficiency anemia, unspecified: Secondary | ICD-10-CM | POA: Insufficient documentation

## 2024-02-20 DIAGNOSIS — Z3009 Encounter for other general counseling and advice on contraception: Secondary | ICD-10-CM

## 2024-02-20 DIAGNOSIS — O3440 Maternal care for other abnormalities of cervix, unspecified trimester: Secondary | ICD-10-CM | POA: Diagnosis not present

## 2024-02-20 DIAGNOSIS — Z6836 Body mass index (BMI) 36.0-36.9, adult: Secondary | ICD-10-CM | POA: Diagnosis not present

## 2024-02-20 NOTE — Progress Notes (Signed)
" ° °  PRENATAL VISIT NOTE  Subjective:  Elizabeth Perkins is a 39 y.o. G3P1011 at [redacted]w[redacted]d being seen today for ongoing prenatal care.  She is currently monitored for the following issues for this high-risk pregnancy and has AMA (advanced maternal age) primigravida 35+, unspecified trimester; Obesity affecting pregnancy; Supervision of other high risk pregnancy, antepartum; H/O: C-section; History of cervical LEEP biopsy affecting care of mother, antepartum; Fetal echogenic intracardiac focus on prenatal ultrasound; HSIL (high grade squamous intraepithelial lesion) on Pap smear of cervix; Unwanted fertility; and Iron deficiency anemia on their problem list.  Patient reports doing well overall.  Contractions: Not present. Vag. Bleeding: None.  Movement: Present. Denies leaking of fluid.   The following portions of the patient's history were reviewed and updated as appropriate: allergies, current medications, past family history, past medical history, past social history, past surgical history and problem list.   Objective:   Vitals:   02/20/24 1115  BP: 98/61  Pulse: 78  Weight: 196 lb (88.9 kg)    Fetal Status: Fetal Heart Rate (bpm): 131   Movement: Present     General:  Alert, oriented and cooperative. Patient is in no acute distress.  Skin: Skin is warm and dry. No rash noted.   Cardiovascular: Normal heart rate noted  Respiratory: Normal respiratory effort, no problems with respiration noted  Abdomen: Soft, gravid, appropriate for gestational age.  Pain/Pressure: Absent      Assessment and Plan:  Pregnancy: G3P1011 at [redacted]w[redacted]d 1. Supervision of other high risk pregnancy, antepartum (Primary) 2. [redacted] weeks gestation of pregnancy Circumcision discussed, desired Recommend RSV vaccine at 32-36wk, declines today but is considering for next visit  3. H/O: C-section 4. Unwanted fertility G1 arrest of descent, 3121g Confirmed plan for repeat w/ BTS - request placed today - Ambulatory Referral  For Surgery Scheduling  5. Fetal echogenic intracardiac focus on prenatal ultrasound LR NIPS  6. History of cervical LEEP biopsy affecting care of mother, antepartum Pap due, will get PP  7. BMI 36.0-36.9,adult 8. AMA (advanced maternal age) primigravida 35+, unspecified trimester ldASA Weekly antenatal testing w/ NSTs to start next visit Growth US  scheduled 1/23; last was @ [redacted]w[redacted]d 1519g (64%)  9. Iron deficiency anemia PO Iron Repeat CBC next visit  Please refer to After Visit Summary for other counseling recommendations.   Return in about 2 weeks (around 03/05/2024) for return OB at 34 weeks with NST.  Future Appointments  Date Time Provider Department Center  03/08/2024  1:30 PM Abigail Rollo DASEN, MD [facility]  03/09/2024 10:15 AM [facility] PROVIDER 1 [facility]  03/09/2024 10:30 AM [facility] US4 [facility]  03/19/2024 10:30 AM Cris Burnard DEL, MD [facility]  03/26/2024 10:50 AM Cleatus Moccasin, MD [facility]  04/02/2024 10:30 AM Erik Kieth BROCKS, MD [facility]    Kieth BROCKS Erik, MD  "

## 2024-02-22 ENCOUNTER — Other Ambulatory Visit: Payer: Self-pay | Admitting: Obstetrics and Gynecology

## 2024-02-22 DIAGNOSIS — Z98891 History of uterine scar from previous surgery: Secondary | ICD-10-CM

## 2024-02-29 ENCOUNTER — Encounter: Payer: Self-pay | Admitting: Obstetrics & Gynecology

## 2024-03-08 ENCOUNTER — Ambulatory Visit: Admitting: Obstetrics and Gynecology

## 2024-03-08 VITALS — BP 108/68 | HR 68 | Wt 196.0 lb

## 2024-03-08 DIAGNOSIS — O09899 Supervision of other high risk pregnancies, unspecified trimester: Secondary | ICD-10-CM | POA: Diagnosis not present

## 2024-03-08 DIAGNOSIS — Z98891 History of uterine scar from previous surgery: Secondary | ICD-10-CM | POA: Diagnosis not present

## 2024-03-08 DIAGNOSIS — O09519 Supervision of elderly primigravida, unspecified trimester: Secondary | ICD-10-CM

## 2024-03-08 DIAGNOSIS — O99019 Anemia complicating pregnancy, unspecified trimester: Secondary | ICD-10-CM | POA: Diagnosis not present

## 2024-03-08 NOTE — Progress Notes (Signed)
" ° °  PRENATAL VISIT NOTE  Subjective:  Elizabeth Perkins is a 39 y.o. G3P1011 at [redacted]w[redacted]d being seen today for ongoing prenatal care.  She is currently monitored for the following issues for this high-risk pregnancy and has AMA (advanced maternal age) primigravida 35+, unspecified trimester; Obesity affecting pregnancy; Supervision of other high risk pregnancy, antepartum; H/O: C-section; History of cervical LEEP biopsy affecting care of mother, antepartum; Fetal echogenic intracardiac focus on prenatal ultrasound; HSIL (high grade squamous intraepithelial lesion) on Pap smear of cervix; Unwanted fertility; and Iron deficiency anemia on their problem list.  Patient reports no complaints.  Contractions: Not present. Vag. Bleeding: None.  Movement: Present. Denies leaking of fluid.   The following portions of the patient's history were reviewed and updated as appropriate: allergies, current medications, past family history, past medical history, past social history, past surgical history and problem list.   Objective:   Vitals:   03/08/24 1338  BP: 108/68  Pulse: 68  Weight: 196 lb (88.9 kg)    Fetal Status:  Fetal Heart Rate (bpm): 124 Fundal Height: 34 cm Movement: Present    General: Alert, oriented and cooperative. Patient is in no acute distress.  Skin: Skin is warm and dry. No rash noted.   Cardiovascular: Normal heart rate noted  Respiratory: Normal respiratory effort, no problems with respiration noted  Abdomen: Soft, gravid, appropriate for gestational age.  Pain/Pressure: Present     Pelvic: Cervical exam deferred        Extremities: Normal range of motion.  Edema: None  Mental Status: Normal mood and affect. Normal behavior. Normal judgment and thought content.   NST FHR 130s, moderate variability, +accels, no decels Toco: no regular contractions      No data to display               No data to display          Assessment and Plan:  Pregnancy: G3P1011 at [redacted]w[redacted]d 1.  Supervision of other high risk pregnancy, antepartum (Primary) Anticipatory guidance RSV vaccine recommended, pt declines  2. H/O: C-section Repeat with BS planned  3. AMA (advanced maternal age) primigravida 35+, unspecified trimester NST today Growth tomorrow Continue weekly antenatal testing   4. Antepartum anemia - CBC   Preterm labor symptoms and general obstetric precautions including but not limited to vaginal bleeding, contractions, leaking of fluid and fetal movement were reviewed in detail with the patient. Please refer to After Visit Summary for other counseling recommendations.   Return in about 2 weeks (around 03/22/2024).  Future Appointments  Date Time Provider Department Center  03/09/2024 10:15 AM [facility] PROVIDER 1 [facility]  03/09/2024 10:30 AM [facility] US4 [facility]  03/19/2024 10:30 AM Cris Burnard DEL, MD [facility]  03/26/2024  2:50 PM Cleatus Moccasin, MD [facility]  04/02/2024 10:30 AM Erik Kieth BROCKS, MD [facility]    Rollo ONEIDA Bring, MD "

## 2024-03-08 NOTE — Addendum Note (Signed)
 Addended by: ORLINDA SILVANO ORN on: 03/08/2024 02:32 PM   Modules accepted: Orders

## 2024-03-09 ENCOUNTER — Ambulatory Visit: Attending: Obstetrics and Gynecology

## 2024-03-09 ENCOUNTER — Ambulatory Visit: Admitting: Maternal & Fetal Medicine

## 2024-03-09 ENCOUNTER — Ambulatory Visit: Payer: Self-pay | Admitting: Obstetrics and Gynecology

## 2024-03-09 VITALS — BP 108/53 | HR 80

## 2024-03-09 DIAGNOSIS — Z9889 Other specified postprocedural states: Secondary | ICD-10-CM

## 2024-03-09 DIAGNOSIS — O34219 Maternal care for unspecified type scar from previous cesarean delivery: Secondary | ICD-10-CM | POA: Diagnosis not present

## 2024-03-09 DIAGNOSIS — O3440 Maternal care for other abnormalities of cervix, unspecified trimester: Secondary | ICD-10-CM

## 2024-03-09 DIAGNOSIS — Z3A35 35 weeks gestation of pregnancy: Secondary | ICD-10-CM

## 2024-03-09 DIAGNOSIS — O99213 Obesity complicating pregnancy, third trimester: Secondary | ICD-10-CM | POA: Diagnosis not present

## 2024-03-09 DIAGNOSIS — O3433 Maternal care for cervical incompetence, third trimester: Secondary | ICD-10-CM

## 2024-03-09 DIAGNOSIS — E669 Obesity, unspecified: Secondary | ICD-10-CM

## 2024-03-09 DIAGNOSIS — O09523 Supervision of elderly multigravida, third trimester: Secondary | ICD-10-CM | POA: Insufficient documentation

## 2024-03-09 LAB — CBC
Hematocrit: 33.2 % — ABNORMAL LOW (ref 34.0–46.6)
Hemoglobin: 10.5 g/dL — ABNORMAL LOW (ref 11.1–15.9)
MCH: 25.2 pg — ABNORMAL LOW (ref 26.6–33.0)
MCHC: 31.6 g/dL (ref 31.5–35.7)
MCV: 80 fL (ref 79–97)
Platelets: 335 x10E3/uL (ref 150–450)
RBC: 4.16 x10E6/uL (ref 3.77–5.28)
RDW: 15.1 % (ref 11.7–15.4)
WBC: 9.2 x10E3/uL (ref 3.4–10.8)

## 2024-03-09 NOTE — Progress Notes (Signed)
 After review, MFM consult with provider is not indicated for today  William Glenn, DO 03/09/2024 12:34 PM  [facility]

## 2024-03-18 ENCOUNTER — Encounter: Payer: Self-pay | Admitting: Obstetrics & Gynecology

## 2024-03-19 ENCOUNTER — Encounter: Admitting: Obstetrics & Gynecology

## 2024-03-21 ENCOUNTER — Telehealth: Payer: Self-pay | Admitting: *Deleted

## 2024-03-21 NOTE — Telephone Encounter (Signed)
 Patient would like for you to check her MyChart message before the end of day to complete work letter.

## 2024-03-23 ENCOUNTER — Encounter (HOSPITAL_COMMUNITY): Payer: Self-pay

## 2024-03-23 NOTE — Patient Instructions (Signed)
"   Annalysia E Godina  03/23/2024   Your procedure is scheduled on:  04/06/2024  Arrive at 0730 at Entrance C on [address] at [facility]  and [address]. You are invited to use the FREE valet parking or use the Visitor's parking deck.  Pick up the phone at the desk and dial [phone].  Call this number if you have problems the morning of surgery: [phone]  Remember:   Do not eat food:(After Midnight) Desps de medianoche.  You may drink clear liquids until  ___0530__.  Clear liquids means a liquid you can see thru.  It can have color such as Cola or Kool aid.  Tea is OK and coffee as long as no milk or creamer of any kind.  Take these medicines the morning of surgery with A SIP OF WATER :  none   Do not wear jewelry, make-up or nail polish.  Do not wear lotions, powders, or perfumes. Do not wear deodorant.  Do not shave 48 hours prior to surgery.  Do not bring valuables to the hospital.  [facility] is not   responsible for any belongings or valuables brought to the hospital.  Contacts, dentures or bridgework may not be worn into surgery.  Leave suitcase in the car. After surgery it may be brought to your room.  For patients admitted to the hospital, checkout time is 11:00 AM the day of              discharge.      Please read over the following fact sheets that you were given:     Preparing for Surgery   "

## 2024-03-26 ENCOUNTER — Encounter: Admitting: Obstetrics and Gynecology

## 2024-04-02 ENCOUNTER — Encounter: Admitting: Obstetrics and Gynecology

## 2024-04-04 ENCOUNTER — Encounter (HOSPITAL_COMMUNITY): Admission: RE | Admit: 2024-04-04 | Source: Ambulatory Visit

## 2024-04-05 ENCOUNTER — Encounter (HOSPITAL_COMMUNITY)

## 2024-04-06 ENCOUNTER — Encounter (HOSPITAL_COMMUNITY): Admission: RE | Payer: Self-pay | Source: Home / Self Care

## 2024-04-06 ENCOUNTER — Inpatient Hospital Stay (HOSPITAL_COMMUNITY): Admission: RE | Admit: 2024-04-06 | Source: Home / Self Care | Admitting: Family Medicine

## 2024-04-06 SURGERY — Surgical Case
Anesthesia: Regional | Laterality: Bilateral
